# Patient Record
Sex: Male | Born: 1945 | Race: White | Hispanic: No | Marital: Married | Smoking: Former smoker
Health system: Southern US, Community
[De-identification: ages and names within clinical notes are randomized; demographics above are authoritative.]

## PROBLEM LIST (undated history)

## (undated) DIAGNOSIS — I10 Essential (primary) hypertension: Secondary | ICD-10-CM

## (undated) DIAGNOSIS — R011 Cardiac murmur, unspecified: Secondary | ICD-10-CM

## (undated) DIAGNOSIS — K219 Gastro-esophageal reflux disease without esophagitis: Secondary | ICD-10-CM

## (undated) DIAGNOSIS — E7800 Pure hypercholesterolemia, unspecified: Secondary | ICD-10-CM

## (undated) DIAGNOSIS — E119 Type 2 diabetes mellitus without complications: Secondary | ICD-10-CM

## (undated) HISTORY — PX: KNEE ARTHROSCOPY: SHX127

## (undated) HISTORY — PX: TONSILLECTOMY AND ADENOIDECTOMY: SUR1326

---

## 2006-03-12 ENCOUNTER — Encounter: Admission: RE | Admit: 2006-03-12 | Discharge: 2006-03-12 | Payer: Self-pay | Admitting: Nephrology

## 2013-09-27 ENCOUNTER — Other Ambulatory Visit: Payer: Self-pay | Admitting: Family Medicine

## 2013-09-27 DIAGNOSIS — R4701 Aphasia: Secondary | ICD-10-CM

## 2013-09-27 DIAGNOSIS — Z136 Encounter for screening for cardiovascular disorders: Secondary | ICD-10-CM

## 2013-10-09 ENCOUNTER — Ambulatory Visit
Admission: RE | Admit: 2013-10-09 | Discharge: 2013-10-09 | Disposition: A | Discharge status: Home / Self Care | Payer: Medicare Other | Source: Ambulatory Visit | Attending: Family Medicine | Admitting: Family Medicine

## 2013-10-09 DIAGNOSIS — R4701 Aphasia: Secondary | ICD-10-CM

## 2013-10-09 DIAGNOSIS — Z136 Encounter for screening for cardiovascular disorders: Secondary | ICD-10-CM

## 2014-02-13 HISTORY — PX: LAPAROSCOPIC ABDOMINAL EXPLORATION: SHX6249

## 2014-05-15 HISTORY — PX: LAPAROSCOPIC CHOLECYSTECTOMY: SUR755

## 2014-12-04 ENCOUNTER — Ambulatory Visit
Admission: RE | Admit: 2014-12-04 | Discharge: 2014-12-04 | Disposition: A | Discharge status: Home / Self Care | Payer: Medicare Other | Source: Ambulatory Visit | Attending: Family Medicine | Admitting: Family Medicine

## 2014-12-04 ENCOUNTER — Other Ambulatory Visit: Payer: Self-pay | Admitting: Family Medicine

## 2014-12-04 DIAGNOSIS — R945 Abnormal results of liver function studies: Secondary | ICD-10-CM

## 2014-12-04 DIAGNOSIS — R7989 Other specified abnormal findings of blood chemistry: Secondary | ICD-10-CM

## 2014-12-04 DIAGNOSIS — R1084 Generalized abdominal pain: Secondary | ICD-10-CM

## 2014-12-18 ENCOUNTER — Observation Stay (HOSPITAL_COMMUNITY)
Admission: EM | Admit: 2014-12-18 | Discharge: 2014-12-20 | Disposition: A | Discharge status: Home / Self Care | Payer: Medicare Other | Attending: Internal Medicine | Admitting: Internal Medicine

## 2014-12-18 ENCOUNTER — Encounter (HOSPITAL_COMMUNITY): Payer: Self-pay | Admitting: Emergency Medicine

## 2014-12-18 DIAGNOSIS — Z6830 Body mass index (BMI) 30.0-30.9, adult: Secondary | ICD-10-CM | POA: Insufficient documentation

## 2014-12-18 DIAGNOSIS — R1011 Right upper quadrant pain: Secondary | ICD-10-CM | POA: Diagnosis present

## 2014-12-18 DIAGNOSIS — E785 Hyperlipidemia, unspecified: Secondary | ICD-10-CM | POA: Insufficient documentation

## 2014-12-18 DIAGNOSIS — I10 Essential (primary) hypertension: Secondary | ICD-10-CM | POA: Diagnosis not present

## 2014-12-18 DIAGNOSIS — Z7982 Long term (current) use of aspirin: Secondary | ICD-10-CM | POA: Diagnosis not present

## 2014-12-18 DIAGNOSIS — E669 Obesity, unspecified: Secondary | ICD-10-CM | POA: Insufficient documentation

## 2014-12-18 DIAGNOSIS — K8050 Calculus of bile duct without cholangitis or cholecystitis without obstruction: Secondary | ICD-10-CM

## 2014-12-18 DIAGNOSIS — L299 Pruritus, unspecified: Secondary | ICD-10-CM | POA: Diagnosis not present

## 2014-12-18 DIAGNOSIS — E119 Type 2 diabetes mellitus without complications: Secondary | ICD-10-CM

## 2014-12-18 DIAGNOSIS — K8051 Calculus of bile duct without cholangitis or cholecystitis with obstruction: Principal | ICD-10-CM | POA: Insufficient documentation

## 2014-12-18 DIAGNOSIS — K831 Obstruction of bile duct: Secondary | ICD-10-CM | POA: Insufficient documentation

## 2014-12-18 DIAGNOSIS — Z87891 Personal history of nicotine dependence: Secondary | ICD-10-CM | POA: Insufficient documentation

## 2014-12-18 DIAGNOSIS — E780 Pure hypercholesterolemia: Secondary | ICD-10-CM | POA: Insufficient documentation

## 2014-12-18 DIAGNOSIS — E869 Volume depletion, unspecified: Secondary | ICD-10-CM | POA: Diagnosis not present

## 2014-12-18 DIAGNOSIS — Z79899 Other long term (current) drug therapy: Secondary | ICD-10-CM | POA: Insufficient documentation

## 2014-12-18 HISTORY — DX: Essential (primary) hypertension: I10

## 2014-12-18 HISTORY — DX: Pure hypercholesterolemia, unspecified: E78.00

## 2014-12-18 HISTORY — DX: Gastro-esophageal reflux disease without esophagitis: K21.9

## 2014-12-18 HISTORY — DX: Type 2 diabetes mellitus without complications: E11.9

## 2014-12-18 HISTORY — DX: Cardiac murmur, unspecified: R01.1

## 2014-12-18 LAB — CBC
HCT: 36.2 % — ABNORMAL LOW (ref 39.0–52.0)
Hemoglobin: 12.3 g/dL — ABNORMAL LOW (ref 13.0–17.0)
MCH: 31.5 pg (ref 26.0–34.0)
MCHC: 34 g/dL (ref 30.0–36.0)
MCV: 92.8 fL (ref 78.0–100.0)
Platelets: 245 10*3/uL (ref 150–400)
RBC: 3.9 MIL/uL — ABNORMAL LOW (ref 4.22–5.81)
RDW: 15.1 % (ref 11.5–15.5)
WBC: 5.2 10*3/uL (ref 4.0–10.5)

## 2014-12-18 LAB — COMPREHENSIVE METABOLIC PANEL
ALT: 833 U/L — ABNORMAL HIGH (ref 17–63)
AST: 319 U/L — ABNORMAL HIGH (ref 15–41)
Albumin: 3.4 g/dL — ABNORMAL LOW (ref 3.5–5.0)
Alkaline Phosphatase: 436 U/L — ABNORMAL HIGH (ref 38–126)
Anion gap: 9 (ref 5–15)
BUN: 19 mg/dL (ref 6–20)
CO2: 26 mmol/L (ref 22–32)
Calcium: 9.4 mg/dL (ref 8.9–10.3)
Chloride: 103 mmol/L (ref 101–111)
Creatinine, Ser: 1.31 mg/dL — ABNORMAL HIGH (ref 0.61–1.24)
GFR calc Af Amer: >60 mL/min (ref >60)
GFR calc non Af Amer: 55 mL/min — ABNORMAL LOW (ref >60)
Glucose, Bld: 167 mg/dL — ABNORMAL HIGH (ref 65–99)
Potassium: 4.4 mmol/L (ref 3.5–5.1)
Sodium: 138 mmol/L (ref 135–145)
Total Bilirubin: 5.2 mg/dL — ABNORMAL HIGH (ref 0.3–1.2)
Total Protein: 6.5 g/dL (ref 6.5–8.1)

## 2014-12-18 LAB — LIPASE, BLOOD: Lipase: 119 U/L — ABNORMAL HIGH (ref 22–51)

## 2014-12-18 MED ORDER — AMLODIPINE BESYLATE 10 MG PO TABS
10.0000 mg | ORAL_TABLET | Freq: Every evening | ORAL | Status: DC
  Administered 2014-12-18 – 2014-12-19 (×2): 10 mg via ORAL
  Filled 2014-12-18 (×2): qty 1

## 2014-12-18 MED ORDER — ENOXAPARIN SODIUM 40 MG/0.4ML ~~LOC~~ SOLN
40.0000 mg | SUBCUTANEOUS | Status: DC
  Administered 2014-12-18 – 2014-12-19 (×2): 40 mg via SUBCUTANEOUS
  Filled 2014-12-18 (×2): qty 0.4

## 2014-12-18 MED ORDER — POTASSIUM CHLORIDE CRYS ER 20 MEQ PO TBCR
20.0000 meq | EXTENDED_RELEASE_TABLET | Freq: Every day | ORAL | Status: DC
  Filled 2014-12-18: qty 1

## 2014-12-18 MED ORDER — ADULT MULTIVITAMIN W/MINERALS CH
1.0000 | ORAL_TABLET | Freq: Every day | ORAL | Status: DC
  Administered 2014-12-18 – 2014-12-19 (×2): 1 via ORAL
  Filled 2014-12-18 (×3): qty 1

## 2014-12-18 MED ORDER — LISINOPRIL 20 MG PO TABS
20.0000 mg | ORAL_TABLET | Freq: Two times a day (BID) | ORAL | Status: DC
  Administered 2014-12-18 – 2014-12-19 (×3): 20 mg via ORAL
  Filled 2014-12-18 (×4): qty 1

## 2014-12-18 MED ORDER — HYDROXYZINE HCL 25 MG PO TABS
50.0000 mg | ORAL_TABLET | Freq: Once | ORAL | Status: AC
  Administered 2014-12-18: 50 mg via ORAL
  Filled 2014-12-18: qty 2

## 2014-12-18 MED ORDER — KCL IN DEXTROSE-NACL 20-5-0.2 MEQ/L-%-% IV SOLN
INTRAVENOUS | Status: DC
  Administered 2014-12-18: 21:00:00 via INTRAVENOUS
  Filled 2014-12-18 (×4): qty 1000

## 2014-12-18 MED ORDER — HYDROCODONE-ACETAMINOPHEN 5-325 MG PO TABS
1.0000 | ORAL_TABLET | ORAL | Status: DC | PRN
  Administered 2014-12-19: 2 via ORAL
  Filled 2014-12-18: qty 2

## 2014-12-18 MED ORDER — ACETAMINOPHEN 325 MG PO TABS: 650.0000 mg | ORAL_TABLET | Freq: Four times a day (QID) | ORAL | Status: DC | PRN

## 2014-12-18 MED ORDER — BISACODYL 5 MG PO TBEC: 5.0000 mg | DELAYED_RELEASE_TABLET | Freq: Every day | ORAL | Status: DC | PRN

## 2014-12-18 MED ORDER — BOOST / RESOURCE BREEZE PO LIQD
1.0000 | Freq: Three times a day (TID) | ORAL | Status: DC
  Administered 2014-12-18 – 2014-12-19 (×2): 1 via ORAL

## 2014-12-18 MED ORDER — ONDANSETRON HCL 4 MG/2ML IJ SOLN
4.0000 mg | Freq: Four times a day (QID) | INTRAMUSCULAR | Status: DC | PRN
Start: 2014-12-18 — End: 2014-12-20

## 2014-12-18 MED ORDER — METFORMIN HCL 500 MG PO TABS
1000.0000 mg | ORAL_TABLET | Freq: Two times a day (BID) | ORAL | Status: DC
  Filled 2014-12-18: qty 2

## 2014-12-18 MED ORDER — ACETAMINOPHEN 650 MG RE SUPP: 650.0000 mg | Freq: Four times a day (QID) | RECTAL | Status: DC | PRN

## 2014-12-18 MED ORDER — INSULIN ASPART 100 UNIT/ML ~~LOC~~ SOLN
0.0000 [IU] | Freq: Three times a day (TID) | SUBCUTANEOUS | Status: DC
  Administered 2014-12-19 – 2014-12-20 (×4): 2 [IU] via SUBCUTANEOUS

## 2014-12-18 MED ORDER — ONDANSETRON HCL 4 MG PO TABS: 4.0000 mg | ORAL_TABLET | Freq: Four times a day (QID) | ORAL | Status: DC | PRN

## 2014-12-18 MED ORDER — PANTOPRAZOLE SODIUM 40 MG PO TBEC
40.0000 mg | DELAYED_RELEASE_TABLET | Freq: Every day | ORAL | Status: DC
  Administered 2014-12-18 – 2014-12-19 (×2): 40 mg via ORAL
  Filled 2014-12-18 (×3): qty 1

## 2014-12-18 NOTE — ED Notes (Signed)
Pt to be NPO at midnight.

## 2014-12-18 NOTE — ED Provider Notes (Signed)
CSN: [id number]     Arrival date & time 12/18/14  1411 History   First MD Initiated Contact with Patient 12/18/14 [phone]     Chief Complaint  Patient presents with  . Pruritis  . Abdominal Pain     (Consider location/radiation/quality/duration/timing/severity/associated sxs/prior Treatment) HPI Comments: Had recent US done by [facility] practice due to intermittent RUQ pain. Showed 3 mm stone in his CBD with mild biliary dilatation. [facility] instructed him to come to the ED for ERCP tomorrow. No pain at this time.  Patient is a 69 y.o. male presenting with abdominal pain. The history is provided by the patient.  Abdominal Pain Pain location:  RUQ Pain quality: sharp   Pain radiates to:  Does not radiate Pain severity:  Moderate Onset quality:  Gradual Timing:  Sporadic Progression:  Unchanged Chronicity:  New Context: not medication withdrawal and not previous surgeries   Relieved by:  Nothing Worsened by:  Nothing tried Associated symptoms: no chest pain, no chills, no cough, no fever, no shortness of breath and no vomiting     Past Medical History  Diagnosis Date  . Diabetes mellitus without complication   . Hypertension   . Hypercholesterolemia    History reviewed. No pertinent past surgical history. No family history on file. History  Substance Use Topics  . Smoking status: Former Games developer  . Smokeless tobacco: Not on file  . Alcohol Use: Yes    Review of Systems  Constitutional: Negative for fever and chills.  Respiratory: Negative for cough and shortness of breath.   Cardiovascular: Negative for chest pain and leg swelling.  Gastrointestinal: Negative for vomiting and abdominal pain.  Genitourinary: Negative for difficulty urinating.  Skin:       Pruritis - diffuse  All other systems reviewed and are negative.     Allergies  Review of patient's allergies indicates not on file.  Home Medications   Prior to Admission medications   Not on File   BP  138/69 mmHg  Pulse 81  Temp(Src) 98.1 F (36.7 C) (Oral)  Resp 18  Ht  (1.803 m)  Wt 217 lb (98.431 kg)  BMI 30.28 kg/m2  SpO2 99% Physical Exam  Constitutional: He is oriented to person, place, and time. He appears well-developed and well-nourished. No distress.  HENT:  Head: Normocephalic and atraumatic.  Mouth/Throat: No oropharyngeal exudate.  Eyes: EOM are normal. Pupils are equal, round, and reactive to light.  Neck: Normal range of motion. Neck supple.  Cardiovascular: Normal rate and regular rhythm.  Exam reveals no friction rub.   No murmur heard. Pulmonary/Chest: Effort normal and breath sounds normal. No respiratory distress. He has no wheezes. He has no rales.  Abdominal: Soft. He exhibits no distension. There is no tenderness. There is no rebound.  Musculoskeletal: Normal range of motion. He exhibits no edema.  Neurological: He is alert and oriented to person, place, and time. No cranial nerve deficit. He exhibits normal muscle tone. Coordination normal.  Skin: No rash noted. He is not diaphoretic.  Nursing note and vitals reviewed.   ED Course  Procedures (including critical care time) Labs Review Labs Reviewed  CBC - Abnormal; Notable for the following:    RBC 3.90 (*)    Hemoglobin 12.3 (*)    HCT 36.2 (*)    All other components within normal limits  COMPREHENSIVE METABOLIC PANEL - Abnormal; Notable for the following:    Glucose, Bld 167 (*)    Creatinine, Ser 1.31 (*)  Albumin 3.4 (*)    AST 319 (*)    ALT 833 (*)    Alkaline Phosphatase 436 (*)    Total Bilirubin 5.2 (*)    GFR calc non Af Amer 55 (*)    All other components within normal limits  LIPASE, BLOOD - Abnormal; Notable for the following:    Lipase 119 (*)    All other components within normal limits    Imaging Review No results found.   EKG Interpretation None      MDM   Final diagnoses:  Common bile duct stone  Obstructive jaundice    32M here with RUQ pain, sent  by [facility] for admission for ERCP. Has been having worsening pruritis and sporadic RUQ pain attacks. No fevers or vomiting. No pain at this time.  Labs show bilirubin of 5.2, elevated LFTs. Dr. Madilyn FiremanHayes aware, will see tomorrow. Dr. Arlean HoppingSchertz admitting.  Elwin MochaBlair Evanee Lubrano, MD 12/18/14 [phone]

## 2014-12-18 NOTE — ED Notes (Signed)
MD at bedside. 

## 2014-12-18 NOTE — H&P (Signed)
[facility] History and Physical  Mitchell Fortesed R Geibel XBM:[id number] DOB: 1945/11/20 DOA: 12/18/2014  Referring physician: Dr Gwendolyn GrantWalden, ED PCP: Dr Sigmund HazelLisa Miller, [facility]: Dr Barrett ShellHayes, [facility]  Chief Complaint: RUQ pain and pruritis  HPI: Mitchell Pham is a 69 y.o. male with hx of HTN and mild DM type 2. He had cholecystectomy in 12/ 15 uncomplicated. Recently was found to have jaundice in conjunction with intermittent bouts of RUQ pain.  Workup last week with US at outside facility showed common bile duct obstruction and he is felt to have a retained stone and in need of ERCP. GI has requested admission for this reason.    Patient describes intermittent RUQ pain episodes lasting for hours.  Also some indigestion and nausea, no vomiting or diarrhea.  No fevers or chills.  No CP, SOB or cough.  ROS   no joint pain  no skin rash  +pruritis last night  + yellow tint to his eyes  orange urine, slow stream recently  no confusion, HA's    Review of Systems: As presented in the history of presenting illness, rest negative.  Past Medical History  Diagnosis Date  . Diabetes mellitus without complication   . Hypertension   . Hypercholesterolemia    History reviewed. No pertinent past surgical history. Social History:  reports that he has quit smoking. He does not have any smokeless tobacco history on file. He reports that he drinks alcohol. He reports that he does not use illicit drugs. Where does patient live home , retired Can patient participate in ADLs? yes  No Known Allergies  Family History: No family history on file.   Prior to Admission medications   Medication Sig Start Date End Date Taking? Authorizing Provider  allopurinol (ZYLOPRIM) 300 MG tablet Take 300 mg by mouth 2 (two) times daily.   Yes Historical Provider, MD  amLODipine (NORVASC) 10 MG tablet Take 10 mg by mouth every evening.   Yes Historical Provider, MD  aspirin 81 MG tablet Take 81 mg by mouth daily.    Yes Historical Provider, MD  atorvastatin (LIPITOR) 10 MG tablet Take 10 mg by mouth daily.   Yes Historical Provider, MD  lisinopril (PRINIVIL,ZESTRIL) 20 MG tablet Take 20 mg by mouth 2 (two) times daily.   Yes Historical Provider, MD  metFORMIN (GLUCOPHAGE) 500 MG tablet Take 500 mg by mouth 4 (four) times daily.   Yes Historical Provider, MD  Misc Natural Products (OSTEO BI-FLEX JOINT SHIELD PO) Take 1 tablet by mouth 2 (two) times daily.   Yes Historical Provider, MD  Multiple Vitamin (MULTIVITAMIN WITH MINERALS) TABS tablet Take 1 tablet by mouth daily.   Yes Historical Provider, MD  omeprazole (PRILOSEC) 20 MG capsule Take 20 mg by mouth See admin instructions. Take 1 tablet by mouth only on Tuesday, Thursdays and saturdays   Yes Historical Provider, MD  potassium chloride SA (K-DUR,KLOR-CON) 20 MEQ tablet Take 20 mEq by mouth daily.   Yes Historical Provider, MD   Labs on Admission:  Basic Metabolic Panel:  Recent Labs Lab 12/18/14 1656  NA 138  K 4.4  CL 103  CO2 26  GLUCOSE 167*  BUN 19  CREATININE 1.31*  CALCIUM 9.4   Liver Function Tests:  Recent Labs Lab 12/18/14 1656  AST 319*  ALT 833*  ALKPHOS 436*  BILITOT 5.2*  PROT 6.5  ALBUMIN 3.4*    Recent Labs Lab 12/18/14 1656  LIPASE 119*   No results for input(s): AMMONIA in  the last 168 hours. CBC:  Recent Labs Lab 12/18/14 1656  WBC 5.2  HGB 12.3*  HCT 36.2*  MCV 92.8  PLT 245   Cardiac Enzymes: No results for input(s): CKTOTAL, CKMB, CKMBINDEX, TROPONINI in the last 168 hours.  BNP (last 3 results) No results for input(s): BNP in the last 8760 hours.  ProBNP (last 3 results) No results for input(s): PROBNP in the last 8760 hours.  CBG: No results for input(s): GLUCAP in the last 168 hours.  Radiological Exams on Admission: No results found.  EKG: pending  Physical Exam: Filed Vitals:   12/18/14 1730 12/18/14 1800 12/18/14 1830 12/18/14 1845  BP: 143/72 128/68 130/71 140/77  Pulse:  81 82 77 81  Temp:      TempSrc:      Resp:      Height:      Weight:      SpO2: 100% 97% 99% 99%   Exam  General:alert, icteric, no distress, calm  Eyes: PERRL, EOMI  ENT: no deformity  Neck: no jvd, no bruits or nodes  Cardiovascular: RRR no MRG  Respiratory: clear bilat , good BS, no rales  Abdomen: soft ntnd no mass or ascites, no hsm  Skin: no ulcers, wounds or gangrene. No LE or UE edema  Musculoskeletal: no joint effusion or deformity  Psychiatric: good judgment and good insight  Neurologic: nonfocal, Ox 3  Assessment/Plan Principal Problem:   Common bile duct (CBD) obstruction Active Problems:   RUQ pain   Pruritus   Hypertension   DM type 2 (diabetes mellitus, type 2)  Assessment: 1. Jaundice/ RUQ pain/ obstructed CBD/ sp chole Dec '15- for admission and work-up per GI, possible ERCP tomorrow. Will keep NPO after MN. Give IVFs' 2. Vol depletion, mild 3. HTN on 2 BP medications 4. DM 2 on metformin 5. HL on statin  Plan - as above   DVT Prophylaxis - LMW heparin  Code Status: full  Family Communication: none here  Disposition Plan: home when stable    Elizzie Westergard D [facility] Pager [phone]  If 7PM-7AM, please contact night-coverage [email or web address] Password [email or web address] 12/18/2014, 7:20 PM

## 2014-12-18 NOTE — ED Notes (Signed)
Pt. Stated, Im here because Im suppose to have a procedure to remove a stone and also I started itching all over . Dr. Dulce Sellarutlaw told me to come over here.

## 2014-12-18 NOTE — ED Notes (Signed)
Turkey sandwich and water provided.

## 2014-12-19 ENCOUNTER — Encounter (HOSPITAL_COMMUNITY): Payer: Self-pay

## 2014-12-19 ENCOUNTER — Encounter (HOSPITAL_COMMUNITY)
Admission: EM | Disposition: A | Discharge status: Home / Self Care | Payer: Self-pay | Source: Home / Self Care | Attending: Emergency Medicine

## 2014-12-19 ENCOUNTER — Observation Stay (HOSPITAL_COMMUNITY): Payer: Medicare Other | Admitting: Anesthesiology

## 2014-12-19 ENCOUNTER — Observation Stay (HOSPITAL_COMMUNITY): Payer: Medicare Other

## 2014-12-19 DIAGNOSIS — Z9889 Other specified postprocedural states: Secondary | ICD-10-CM | POA: Diagnosis not present

## 2014-12-19 DIAGNOSIS — E119 Type 2 diabetes mellitus without complications: Secondary | ICD-10-CM

## 2014-12-19 DIAGNOSIS — K8051 Calculus of bile duct without cholangitis or cholecystitis with obstruction: Secondary | ICD-10-CM | POA: Diagnosis not present

## 2014-12-19 DIAGNOSIS — K831 Obstruction of bile duct: Secondary | ICD-10-CM

## 2014-12-19 DIAGNOSIS — I10 Essential (primary) hypertension: Secondary | ICD-10-CM

## 2014-12-19 DIAGNOSIS — E780 Pure hypercholesterolemia: Secondary | ICD-10-CM | POA: Diagnosis not present

## 2014-12-19 DIAGNOSIS — R748 Abnormal levels of other serum enzymes: Secondary | ICD-10-CM | POA: Diagnosis not present

## 2014-12-19 HISTORY — PX: ERCP: SHX5425

## 2014-12-19 LAB — COMPREHENSIVE METABOLIC PANEL
ALT: 663 U/L — ABNORMAL HIGH (ref 17–63)
AST: 203 U/L — ABNORMAL HIGH (ref 15–41)
Albumin: 3.2 g/dL — ABNORMAL LOW (ref 3.5–5.0)
Alkaline Phosphatase: 388 U/L — ABNORMAL HIGH (ref 38–126)
Anion gap: 8 (ref 5–15)
BUN: 14 mg/dL (ref 6–20)
CO2: 23 mmol/L (ref 22–32)
Calcium: 9.3 mg/dL (ref 8.9–10.3)
Chloride: 106 mmol/L (ref 101–111)
Creatinine, Ser: 1.23 mg/dL (ref 0.61–1.24)
GFR calc Af Amer: >60 mL/min (ref >60)
GFR calc non Af Amer: 59 mL/min — ABNORMAL LOW (ref >60)
Glucose, Bld: 240 mg/dL — ABNORMAL HIGH (ref 65–99)
Potassium: 4.4 mmol/L (ref 3.5–5.1)
Sodium: 137 mmol/L (ref 135–145)
Total Bilirubin: 5.2 mg/dL — ABNORMAL HIGH (ref 0.3–1.2)
Total Protein: 6.4 g/dL — ABNORMAL LOW (ref 6.5–8.1)

## 2014-12-19 LAB — CBC
HCT: 34.8 % — ABNORMAL LOW (ref 39.0–52.0)
Hemoglobin: 11.6 g/dL — ABNORMAL LOW (ref 13.0–17.0)
MCH: 30.9 pg (ref 26.0–34.0)
MCHC: 33.3 g/dL (ref 30.0–36.0)
MCV: 92.6 fL (ref 78.0–100.0)
Platelets: 238 10*3/uL (ref 150–400)
RBC: 3.76 MIL/uL — ABNORMAL LOW (ref 4.22–5.81)
RDW: 15.1 % (ref 11.5–15.5)
WBC: 5.7 10*3/uL (ref 4.0–10.5)

## 2014-12-19 LAB — GLUCOSE, CAPILLARY
Glucose-Capillary: 182 mg/dL — ABNORMAL HIGH (ref 65–99)
Glucose-Capillary: 167 mg/dL — ABNORMAL HIGH (ref 65–99)
Glucose-Capillary: 160 mg/dL — ABNORMAL HIGH (ref 65–99)
Glucose-Capillary: 262 mg/dL — ABNORMAL HIGH (ref 65–99)

## 2014-12-19 SURGERY — ERCP, WITH INTERVENTION IF INDICATED
Anesthesia: General

## 2014-12-19 MED ORDER — LIDOCAINE HCL (CARDIAC) 20 MG/ML IV SOLN
INTRAVENOUS | Status: DC | PRN
  Administered 2014-12-19: 100 mg via INTRAVENOUS

## 2014-12-19 MED ORDER — LACTATED RINGERS IV SOLN
INTRAVENOUS | Status: DC | PRN
  Administered 2014-12-19: 14:00:00 via INTRAVENOUS

## 2014-12-19 MED ORDER — PROPOFOL 10 MG/ML IV BOLUS
INTRAVENOUS | Status: DC | PRN
  Administered 2014-12-19: 150 mg via INTRAVENOUS

## 2014-12-19 MED ORDER — PHENYLEPHRINE HCL 10 MG/ML IJ SOLN
INTRAMUSCULAR | Status: DC | PRN
  Administered 2014-12-19 (×2): 120 ug via INTRAVENOUS
  Administered 2014-12-19: 80 ug via INTRAVENOUS
  Administered 2014-12-19: 120 ug via INTRAVENOUS
  Administered 2014-12-19 (×2): 80 ug via INTRAVENOUS
  Administered 2014-12-19: 120 ug via INTRAVENOUS

## 2014-12-19 MED ORDER — MIDAZOLAM HCL 5 MG/5ML IJ SOLN
INTRAMUSCULAR | Status: DC | PRN
  Administered 2014-12-19: 2 mg via INTRAVENOUS

## 2014-12-19 MED ORDER — PHENYLEPHRINE HCL 10 MG/ML IJ SOLN
10.0000 mg | INTRAVENOUS | Status: DC | PRN
  Administered 2014-12-19: 100 ug/min via INTRAVENOUS

## 2014-12-19 MED ORDER — IOHEXOL 350 MG/ML SOLN
INTRAVENOUS | Status: DC | PRN
  Administered 2014-12-19: 40 mL

## 2014-12-19 MED ORDER — SODIUM CHLORIDE 0.9 % IV SOLN: INTRAVENOUS | Status: DC

## 2014-12-19 MED ORDER — SUCCINYLCHOLINE CHLORIDE 20 MG/ML IJ SOLN
INTRAMUSCULAR | Status: DC | PRN
  Administered 2014-12-19: 100 mg via INTRAVENOUS

## 2014-12-19 MED ORDER — FENTANYL CITRATE (PF) 100 MCG/2ML IJ SOLN
INTRAMUSCULAR | Status: DC | PRN
  Administered 2014-12-19: 100 ug via INTRAVENOUS

## 2014-12-19 MED ORDER — FENTANYL CITRATE (PF) 100 MCG/2ML IJ SOLN: 25.0000 ug | INTRAMUSCULAR | Status: DC | PRN

## 2014-12-19 MED ORDER — PROMETHAZINE HCL 25 MG/ML IJ SOLN: 6.2500 mg | INTRAMUSCULAR | Status: DC | PRN

## 2014-12-19 NOTE — Consult Note (Signed)
[facility] Consult Note  Referring Provider: No ref. provider found Primary Care Physician:  Tawanna Solo, MD Primary Gastroenterologist:  Dr.  Laurel Dimmer Complaint: Abdominal pain and pruritus HPI: Mitchell Pham is an 69 y.o. white male  with a history of cholecystectomy in December 2015 presents with intermittent right upper quadrant colicky abdominal pain with elevated LFTs and pruritus. He had an MRCP recently which showed a 3 mm common bile duct stone and presents for ERCP. He has no other complaints.  Past Medical History  Diagnosis Date  . Hypertension   . Hypercholesterolemia   . Heart murmur     "told I have one once"  . Type II diabetes mellitus   . GERD (gastroesophageal reflux disease)     Past Surgical History  Procedure Laterality Date  . Tonsillectomy and adenoidectomy  ~ 1955  . Knee arthroscopy Left ~ 1988  . Laparoscopic cholecystectomy  05/2014  . Laparoscopic abdominal exploration  02/2014    "tried to take my gallbladder out; it was too inflamed"    Medications Prior to Admission  Medication Sig Dispense Refill  . allopurinol (ZYLOPRIM) 300 MG tablet Take 300 mg by mouth 2 (two) times daily.    [name] Kitchen amLODipine (NORVASC) 10 MG tablet Take 10 mg by mouth every evening.    [name] Kitchen aspirin 81 MG tablet Take 81 mg by mouth daily.    [name] Kitchen atorvastatin (LIPITOR) 10 MG tablet Take 10 mg by mouth daily.    [name] Kitchen lisinopril (PRINIVIL,ZESTRIL) 20 MG tablet Take 20 mg by mouth 2 (two) times daily.    . metFORMIN (GLUCOPHAGE) 500 MG tablet Take 1,000 mg by mouth 2 (two) times daily with a meal.     . Misc Natural Products (OSTEO BI-FLEX JOINT SHIELD PO) Take 1 tablet by mouth 2 (two) times daily.    . Multiple Vitamin (MULTIVITAMIN WITH MINERALS) TABS tablet Take 1 tablet by mouth daily.    [name] Kitchen omeprazole (PRILOSEC) 20 MG capsule Take 20 mg by mouth See admin instructions. Take 1 tablet by mouth only on Tuesday, Thursdays and saturdays    . potassium chloride SA  (K-DUR,KLOR-CON) 20 MEQ tablet Take 20 mEq by mouth daily.      Allergies: No Known Allergies  History reviewed. No pertinent family history.  Social History:  reports that he quit smoking about 28 years ago. His smoking use included Cigarettes. He has a 40 pack-year smoking history. He has never used smokeless tobacco. He reports that he drinks about 3.6 oz of alcohol per week. He reports that he does not use illicit drugs.  Review of Systems: negative except as above   Blood pressure 110/48, pulse 73, temperature 99.1 F (37.3 C), temperature source Oral, resp. rate 16, height $RemoveBe'5\' 11"'jRjrdMylm$  (1.803 m), weight 98.431 kg (217 lb), SpO2 99 %. Head: Normocephalic, without obvious abnormality, atraumatic Neck: no adenopathy, no carotid bruit, no JVD, supple, symmetrical, trachea midline and thyroid not enlarged, symmetric, no tenderness/mass/nodules Resp: clear to auscultation bilaterally Cardio: regular rate and rhythm, S1, S2 normal, no murmur, click, rub or gallop GI: Abdomen soft nondistended with normoactive bowel sounds. No hepatomegaly masses or guarding Extremities: extremities normal, atraumatic, no cyanosis or edema  Results for orders placed or performed during the hospital encounter of 12/18/14 (from the past 48 hour(s))  CBC     Status: Abnormal   Collection Time: 12/18/14  4:56 PM  Result Value Ref Range   WBC 5.2 4.0 - 10.5 K/uL   RBC 3.90 (L) 4.22 -  5.81 MIL/uL   Hemoglobin 12.3 (L) 13.0 - 17.0 g/dL   HCT 36.2 (L) 39.0 - 52.0 %   MCV 92.8 78.0 - 100.0 fL   MCH 31.5 26.0 - 34.0 pg   MCHC 34.0 30.0 - 36.0 g/dL   RDW 15.1 11.5 - 15.5 %   Platelets 245 150 - 400 K/uL  Comprehensive metabolic panel     Status: Abnormal   Collection Time: 12/18/14  4:56 PM  Result Value Ref Range   Sodium 138 135 - 145 mmol/L   Potassium 4.4 3.5 - 5.1 mmol/L   Chloride 103 101 - 111 mmol/L   CO2 26 22 - 32 mmol/L   Glucose, Bld 167 (H) 65 - 99 mg/dL   BUN 19 6 - 20 mg/dL   Creatinine, Ser  1.31 (H) 0.61 - 1.24 mg/dL   Calcium 9.4 8.9 - 10.3 mg/dL   Total Protein 6.5 6.5 - 8.1 g/dL   Albumin 3.4 (L) 3.5 - 5.0 g/dL   AST 319 (H) 15 - 41 U/L   ALT 833 (H) 17 - 63 U/L   Alkaline Phosphatase 436 (H) 38 - 126 U/L   Total Bilirubin 5.2 (H) 0.3 - 1.2 mg/dL   GFR calc non Af Amer 55 (L) >60 mL/min   GFR calc Af Amer >60 >60 mL/min    Comment: (NOTE) The eGFR has been calculated using the CKD EPI equation. This calculation has not been validated in all clinical situations. eGFR's persistently <60 mL/min signify possible Chronic Kidney Disease.    Anion gap 9 5 - 15  Lipase, blood     Status: Abnormal   Collection Time: 12/18/14  4:56 PM  Result Value Ref Range   Lipase 119 (H) 22 - 51 U/L  Comprehensive metabolic panel     Status: Abnormal   Collection Time: 12/19/14  4:26 AM  Result Value Ref Range   Sodium 137 135 - 145 mmol/L   Potassium 4.4 3.5 - 5.1 mmol/L   Chloride 106 101 - 111 mmol/L   CO2 23 22 - 32 mmol/L   Glucose, Bld 240 (H) 65 - 99 mg/dL   BUN 14 6 - 20 mg/dL   Creatinine, Ser 1.23 0.61 - 1.24 mg/dL   Calcium 9.3 8.9 - 10.3 mg/dL   Total Protein 6.4 (L) 6.5 - 8.1 g/dL   Albumin 3.2 (L) 3.5 - 5.0 g/dL   AST 203 (H) 15 - 41 U/L   ALT 663 (H) 17 - 63 U/L   Alkaline Phosphatase 388 (H) 38 - 126 U/L   Total Bilirubin 5.2 (H) 0.3 - 1.2 mg/dL   GFR calc non Af Amer 59 (L) >60 mL/min   GFR calc Af Amer >60 >60 mL/min    Comment: (NOTE) The eGFR has been calculated using the CKD EPI equation. This calculation has not been validated in all clinical situations. eGFR's persistently <60 mL/min signify possible Chronic Kidney Disease.    Anion gap 8 5 - 15  CBC     Status: Abnormal   Collection Time: 12/19/14  4:26 AM  Result Value Ref Range   WBC 5.7 4.0 - 10.5 K/uL   RBC 3.76 (L) 4.22 - 5.81 MIL/uL   Hemoglobin 11.6 (L) 13.0 - 17.0 g/dL   HCT 34.8 (L) 39.0 - 52.0 %   MCV 92.6 78.0 - 100.0 fL   MCH 30.9 26.0 - 34.0 pg   MCHC 33.3 30.0 - 36.0 g/dL   RDW  15.1 11.5 - 15.5 %  Platelets 238 150 - 400 K/uL  Glucose, capillary     Status: Abnormal   Collection Time: 12/19/14  7:31 AM  Result Value Ref Range   Glucose-Capillary 182 (H) 65 - 99 mg/dL   No results found.  Assessment: Common bile duct stone Plan:  Will proceed with ERCP. Risks rationale and alternatives were explained to the patient and he wishes to proceed. Damika Harmon C 12/19/2014, 8:18 AM  Pager [phone] If no answer or after 5 PM call [phone]

## 2014-12-19 NOTE — Progress Notes (Signed)
Inpatient Diabetes Program Recommendations  AACE/ADA: New Consensus Statement on Inpatient Glycemic Control (2013)  Target Ranges:  Prepandial:   less than 140 mg/dL      Peak postprandial:   less than 180 mg/dL (1-2 hours)      Critically ill patients:  140 - 180 mg/dL    Inpatient Diabetes Program Recommendations Correction (SSI): Noted order for sensitive correction tidwc; however patient is NPO for ERCP this afternoon-unsure of po status folllowing procedure. May want to consider changing correction q 4 hrs during this time. Oral Agents: Metformin on hold while po intake is NPO or minimal  Thank you Lenor CoffinAnn Banesa Tristan, RN, MSN, CDE  Diabetes Inpatient Program Office: [phone] Pager: [phone] 8:00 am to 5:00 pm

## 2014-12-19 NOTE — Transfer of Care (Signed)
Immediate Anesthesia Transfer of Care Note  Patient: Mitchell Pham  Procedure(s) Performed: Procedure(s): ENDOSCOPIC RETROGRADE CHOLANGIOPANCREATOGRAPHY (ERCP) (N/A)  Patient Location: Endoscopy Unit  Anesthesia Type:General  Level of Consciousness: awake  Airway & Oxygen Therapy: Patient Spontanous Breathing and Patient connected to nasal cannula oxygen  Post-op Assessment: Report given to RN, Post -op Vital signs reviewed and stable and Patient moving all extremities  Post vital signs: Reviewed and stable  Last Vitals:  Filed Vitals:   12/19/14 1555  BP: 142/54  Pulse: 73  Temp: 36.8 C  Resp: 15    Complications: No apparent anesthesia complications

## 2014-12-19 NOTE — Progress Notes (Signed)
PROGRESS NOTE  Mitchell Pham UJW:[id number] DOB: [birth year] DOA: 12/18/2014 PCP: Neldon Labella, MD  HPI/Recap of past 24 hours:  S/p ERCP with stone extraction, feeling better, denies n/v, no pain, tolerating diet, ambulating.  Assessment/Plan: Principal Problem:   Common bile duct (CBD) obstruction Active Problems:   RUQ pain   Pruritus   Hypertension   DM type 2 (diabetes mellitus, type 2)  Obstructed CBD stone s/p ERCP with sphincterotomy and stone extraction, expecting lft/lipase trending down.   noninsulin dependent diabetes: reported last a1c was between 5-6, metformin held here, resume at discharge.  Htn: well controlled on norvasc, lisinopril. Resume asa in am.  Code Status: full  Family Communication: patient and wife in room  Disposition Plan: home tomorrow if medically stable   Consultants:  GI  Procedures: ERCP with sphincterotomy and stone extraction 7/6  Antibiotics:  none   Objective: BP 128/70 mmHg  Pulse 68  Temp(Src) 98.3 F (36.8 C) (Oral)  Resp 16  Ht  (1.803 m)  Wt 98.431 kg (217 lb)  BMI 30.28 kg/m2  SpO2 99%  Intake/Output Summary (Last 24 hours) at 12/19/14 1830 Last data filed at 12/19/14 1551  Gross per 24 hour  Intake   1500 ml  Output    350 ml  Net   1150 ml   Filed Weights   12/18/14 1444 12/18/14 2012  Weight: 98.431 kg (217 lb) 98.431 kg (217 lb)    Exam:   General:  NAD  Cardiovascular: RRR  Respiratory: CTABL  Abdomen: Soft/ND/NT, positive BS  Musculoskeletal: No Edema  Neuro: aaox3  Data Reviewed: Basic Metabolic Panel:  Recent Labs Lab 12/18/14 1656 12/19/14 0426  NA 138 137  K 4.4 4.4  CL 103 106  CO2 26 23  GLUCOSE 167* 240*  BUN 19 14  CREATININE 1.31* 1.23  CALCIUM 9.4 9.3   Liver Function Tests:  Recent Labs Lab 12/18/14 1656 12/19/14 0426  AST 319* 203*  ALT 833* 663*  ALKPHOS 436* 388*  BILITOT 5.2* 5.2*  PROT 6.5 6.4*  ALBUMIN 3.4* 3.2*    Recent  Labs Lab 12/18/14 1656  LIPASE 119*   No results for input(s): AMMONIA in the last 168 hours. CBC:  Recent Labs Lab 12/18/14 1656 12/19/14 0426  WBC 5.2 5.7  HGB 12.3* 11.6*  HCT 36.2* 34.8*  MCV 92.8 92.6  PLT 245 238   Cardiac Enzymes:   No results for input(s): CKTOTAL, CKMB, CKMBINDEX, TROPONINI in the last 168 hours. BNP (last 3 results) No results for input(s): BNP in the last 8760 hours.  ProBNP (last 3 results) No results for input(s): PROBNP in the last 8760 hours.  CBG:  Recent Labs Lab 12/19/14 0731 12/19/14 1213 12/19/14 1726  GLUCAP 182* 167* 160*    No results found for this or any previous visit (from the past 240 hour(s)).   Studies: Dg Ercp Biliary & Pancreatic Ducts  12/19/2014   CLINICAL DATA:  ERCP with sphincterotomy removal of large biliary stone  EXAM: ERCP  TECHNIQUE: Multiple spot images obtained with the fluoroscopic device and submitted for interpretation post-procedure.  COMPARISON:  Abdominal ultrasound -12/04/2014  FINDINGS: Five spot intraoperative fluoroscopic images of the right upper abdominal quadrant during ERCP are provided for review.  Initial image demonstrates an ERCP probe overlying the right upper abdominal quadrant. Surgical clips overlie the expected location of the gallbladder fossa.  There is selective cannulation and opacification of the CBD. There is an ill-defined persistent filling defect within  the distal aspect of the CBD. This finding is associated with mild dilatation of the common bile duct.  Subsequent images demonstrate insufflation of a biliary angioplasty balloon within the central aspect of the CBD with subsequent biliary sweeping and presumed sphincterotomy.  There is minimal opacification of the cystic duct. There is minimal opacification of the intrahepatic biliary tree which appears nondilated. There is no definitive opacification of the pancreatic duct.  IMPRESSION: ERCP with findings suggestive of  choledocholithiasis with subsequent biliary sweeping and presumed sphincterotomy as detailed above.  These images were submitted for radiologic interpretation only. Please see the procedural report for the amount of contrast and the fluoroscopy time utilized.   Electronically Signed   By: Simonne ComeJohn  Watts M.D.   On: 12/19/2014 16:28    Scheduled Meds: . amLODipine  10 mg Oral QPM  . enoxaparin (LOVENOX) injection  40 mg Subcutaneous Q24H  . feeding supplement (RESOURCE BREEZE)  1 Container Oral TID BM  . insulin aspart  0-9 Units Subcutaneous TID WC  . lisinopril  20 mg Oral BID  . multivitamin with minerals  1 tablet Oral Daily  . pantoprazole  40 mg Oral Daily    Continuous Infusions:    Time spent: 25mins  Aiman Sonn MD, PhD  [facility] Pager [phone]. If 7PM-7AM, please contact night-coverage at [email or web address], password [email or web address] 12/19/2014, 6:30 PM

## 2014-12-19 NOTE — Anesthesia Preprocedure Evaluation (Addendum)
Anesthesia Evaluation  Patient identified by MRN, date of birth, ID band Patient awake    Reviewed: Allergy & Precautions, NPO status , Patient's Chart, lab work & pertinent test results  Airway Mallampati: II  TM Distance: >3 FB Neck ROM: Full    Dental   Pulmonary former smoker,  breath sounds clear to auscultation        Cardiovascular hypertension, Pt. on medications Rhythm:Regular Rate:Normal     Neuro/Psych negative neurological ROS     GI/Hepatic GERD-  ,CBD stone with obstructive jaundice   Endo/Other  diabetes, Type 2, Oral Hypoglycemic Agents  Renal/GU      Musculoskeletal   Abdominal   Peds  Hematology  (+) anemia ,   Anesthesia Other Findings   Reproductive/Obstetrics                            Anesthesia Physical Anesthesia Plan  ASA: III  Anesthesia Plan: General   Post-op Pain Management:    Induction: Intravenous  Airway Management Planned: Oral ETT  Additional Equipment:   Intra-op Plan:   Post-operative Plan: Extubation in OR  Informed Consent: I have reviewed the patients History and Physical, chart, labs and discussed the procedure including the risks, benefits and alternatives for the proposed anesthesia with the patient or authorized representative who has indicated his/her understanding and acceptance.   Dental advisory given  Plan Discussed with: CRNA  Anesthesia Plan Comments:         Anesthesia Quick Evaluation

## 2014-12-19 NOTE — Op Note (Signed)
[facility] [address] [place], [id number]   ERCP PROCEDURE REPORT        EXAM DATE: 12/19/2014  PATIENT NAME:          Mitchell Pham, Mitchell Pham          MR #: [id number] BIRTHDATE:       1946/01/14     VISIT #:     [id number] ATTENDING:     Dorena CookeyJohn Josuha Fontanez, MD     STATUS:     outpatient ASSISTANT:      Omelia Blackwaterarpenter, Shelby and Nilsa NuttingAkande, Felicia   INDICATIONS:  The patient is a 69 yr old male here for an ERCP due to PROCEDURE PERFORMED:     ERCP with sphincterotomy and stone extraction MEDICATIONS:     general anesthesia  CONSENT: The patient understands the risks and benefits of the procedure and understands that these risks include, but are not limited to: sedation, allergic reaction, infection, perforation and/or bleeding. Alternative means of evaluation and treatment include, among others: physical exam, x-rays, and/or surgical intervention. The patient elects to proceed with this endoscopic procedure.  DESCRIPTION OF PROCEDURE: During intra-op preparation period all mechanical & medical equipment was checked for proper function. Hand hygiene and appropriate measures for infection prevention was taken. After the risks, benefits and alternatives of the procedure were thoroughly explained, Informed was verified, confirmed and timeout was successfully executed by the treatment team. With the patient in left semi-prone position, medications were administered intravenously.The Pentax Ercp Scope [id number] was passed from the mouth into the esophagus and further advanced from the esophagus into the stomach. From stomach scope was directed to the papilla of Vater     .  Major papilla was aligned with the duodenoscope. The scope position was confirmed fluoroscopically. Rest of the findings/therapeutics are given below. The scope was then completely withdrawn from the patient and the procedure completed. The pulse, BP, and O2 saturation were monitored and  documented by the physician and the nursing staff throughout the entire procedure. The patient was cared for as planned according to standard protocol. The patient was then discharged to recovery in stable condition and with appropriate post procedure care. Estimated blood loss is zero unless otherwise noted in this procedure report.  the papilla had a somewhat enlarged profile and was cannulated selectively with a guidewire and sphincterotome. A cholangiogram was obtained which showed a single distal stone with moderate dilatation. A generous sphincterotomy was performed and attempts to remove the stone with a 12-15 mm balloon catheter were unsuccessful. The sphincterotomy was then enlarged and with the 15-18 adjustable balloon catheter we were able to deliver a single blackish 5 mm stone. No other stones were seen on terminal cholangiogram and there was good flow through the papilla. Pancreatic duct was not entered with wire or contrast material     ADVERSE EVENT:     none immediate IMPRESSIONS:     common bile duct stone removed after sphincterotomy  RECOMMENDATIONS:     observe for complications and check liver function tests in the morning REPEAT EXAM:     when necessary   ___________________________________ Dorena CookeyJohn Mizuki Hoel, MD eSigned:  Dorena CookeyJohn Yassen Kinnett, MD 12/19/2014 3:46 PM   cc:  CPT CODES: ICD9 CODES:  The ICD and CPT codes recommended by this software are interpretations from the data that the clinical staff has captured with the software.  The verification of the translation of this report to the ICD and CPT codes and modifiers is the  sole responsibility of the health care institution and practicing physician where this report was generated.  [employer] will not be held responsible for the validity of the ICD and CPT codes included on this report.  AMA assumes no liability for data contained or not contained herein. CPT is a Publishing rights manager of  the [employer].   PATIENT NAME:  [name], [name] MR#: [id number]

## 2014-12-19 NOTE — Interval H&P Note (Signed)
History and Physical Interval Note:  12/19/2014 2:34 PM  Mitchell Pham  has presented today for surgery, with the diagnosis of Common bile duct stone  The various methods of treatment have been discussed with the patient and family. After consideration of risks, benefits and other options for treatment, the patient has consented to  Procedure(s): ENDOSCOPIC RETROGRADE CHOLANGIOPANCREATOGRAPHY (ERCP) (N/A) as a surgical intervention .  The patient's history has been reviewed, patient examined, no change in status, stable for surgery.  I have reviewed the patient's chart and labs.  Questions were answered to the patient's satisfaction.     Moria Brophy C

## 2014-12-19 NOTE — Anesthesia Procedure Notes (Signed)
Procedure Name: Intubation Date/Time: 12/19/2014 2:50 PM Performed by: Charm BargesBUTLER, Azaylah Stailey R Pre-anesthesia Checklist: Patient identified, Emergency Drugs available, Suction available, Patient being monitored and Timeout performed Patient Re-evaluated:Patient Re-evaluated prior to inductionOxygen Delivery Method: Circle system utilized Preoxygenation: Pre-oxygenation with 100% oxygen Intubation Type: IV induction Ventilation: Mask ventilation without difficulty Laryngoscope Size: Mac and 4 Grade View: Grade II Tube type: Oral Tube size: 7.5 mm Number of attempts: 1 Airway Equipment and Method: Stylet Placement Confirmation: ETT inserted through vocal cords under direct vision and positive ETCO2 Secured at: 22 cm Tube secured with: Tape Dental Injury: Teeth and Oropharynx as per pre-operative assessment

## 2014-12-19 NOTE — H&P (View-Only) (Signed)
Inpatient Diabetes Program Recommendations  AACE/ADA: New Consensus Statement on Inpatient Glycemic Control (2013)  Target Ranges:  Prepandial:   less than 140 mg/dL      Peak postprandial:   less than 180 mg/dL (1-2 hours)      Critically ill patients:  140 - 180 mg/dL    Inpatient Diabetes Program Recommendations Correction (SSI): Noted order for sensitive correction tidwc; however patient is NPO for ERCP this afternoon-unsure of po status folllowing procedure. May want to consider changing correction q 4 hrs during this time. Oral Agents: Metformin on hold while po intake is NPO or minimal  Thank you Waynetta Metheny, RN, MSN, CDE  Diabetes Inpatient Program Office: [phone] Pager: [phone] 8:00 am to 5:00 pm    

## 2014-12-19 NOTE — Progress Notes (Signed)
Nutrition Brief Note  Patient identified on the Malnutrition Screening Tool (MST) Report  Wt Readings from Last 15 Encounters:  12/18/14 217 lb (98.431 kg)   Mitchell Pham is an 69 y.o. white male with a history of cholecystectomy in December 2015 presents with intermittent right upper quadrant colicky abdominal pain with elevated LFTs and pruritus. He had an MRCP recently which showed a 3 mm common bile duct stone and presents for ERCP. He has no other complaints.  Pt reports mainly intentional weight loss over the past few months, but estimates he lost a few pounds over the past week due to decreased oral intake as a result of abdominal pain. He is scheduled for an ERCP later on today.   Nutrition-focused physical exam completed. No signs of cat or muscle depletion noted.   Body mass index is 30.28 kg/(m^2). Patient meets criteria for obesity, class I based on current BMI.   Current diet order is NPO (awaiting ERCP today), patient is consuming approximately n/a% of meals at this time. Labs and medications reviewed.   No nutrition interventions warranted at this time. If nutrition issues arise, please consult RD.   Maximo Spratling A. Mayford KnifeWilliams, RD, LDN, CDE Pager: [phone] After hours Pager: [phone]

## 2014-12-20 ENCOUNTER — Encounter (HOSPITAL_COMMUNITY): Payer: Self-pay | Admitting: Gastroenterology

## 2014-12-20 DIAGNOSIS — K831 Obstruction of bile duct: Secondary | ICD-10-CM | POA: Diagnosis not present

## 2014-12-20 DIAGNOSIS — Z9889 Other specified postprocedural states: Secondary | ICD-10-CM

## 2014-12-20 DIAGNOSIS — I10 Essential (primary) hypertension: Secondary | ICD-10-CM | POA: Diagnosis not present

## 2014-12-20 DIAGNOSIS — R748 Abnormal levels of other serum enzymes: Secondary | ICD-10-CM

## 2014-12-20 LAB — COMPREHENSIVE METABOLIC PANEL
ALT: 461 U/L — ABNORMAL HIGH (ref 17–63)
AST: 115 U/L — ABNORMAL HIGH (ref 15–41)
Albumin: 3 g/dL — ABNORMAL LOW (ref 3.5–5.0)
Alkaline Phosphatase: 345 U/L — ABNORMAL HIGH (ref 38–126)
Anion gap: 8 (ref 5–15)
BUN: 16 mg/dL (ref 6–20)
CO2: 25 mmol/L (ref 22–32)
Calcium: 9.1 mg/dL (ref 8.9–10.3)
Chloride: 103 mmol/L (ref 101–111)
Creatinine, Ser: 1.36 mg/dL — ABNORMAL HIGH (ref 0.61–1.24)
GFR calc Af Amer: >60 mL/min (ref >60)
GFR calc non Af Amer: 53 mL/min — ABNORMAL LOW (ref >60)
Glucose, Bld: 219 mg/dL — ABNORMAL HIGH (ref 65–99)
Potassium: 4.4 mmol/L (ref 3.5–5.1)
Sodium: 136 mmol/L (ref 135–145)
Total Bilirubin: 5.7 mg/dL — ABNORMAL HIGH (ref 0.3–1.2)
Total Protein: 6.4 g/dL — ABNORMAL LOW (ref 6.5–8.1)

## 2014-12-20 LAB — GLUCOSE, CAPILLARY: Glucose-Capillary: 170 mg/dL — ABNORMAL HIGH (ref 65–99)

## 2014-12-20 LAB — CBC
HCT: 34.6 % — ABNORMAL LOW (ref 39.0–52.0)
Hemoglobin: 11.6 g/dL — ABNORMAL LOW (ref 13.0–17.0)
MCH: 31.2 pg (ref 26.0–34.0)
MCHC: 33.5 g/dL (ref 30.0–36.0)
MCV: 93 fL (ref 78.0–100.0)
Platelets: 216 10*3/uL (ref 150–400)
RBC: 3.72 MIL/uL — ABNORMAL LOW (ref 4.22–5.81)
RDW: 15.1 % (ref 11.5–15.5)
WBC: 4.8 10*3/uL (ref 4.0–10.5)

## 2014-12-20 LAB — URINALYSIS, ROUTINE W REFLEX MICROSCOPIC
Glucose, UA: NEGATIVE mg/dL
Hgb urine dipstick: NEGATIVE
Ketones, ur: 15 mg/dL — AB
Nitrite: POSITIVE — AB
Protein, ur: NEGATIVE mg/dL
Specific Gravity, Urine: 1.016 (ref 1.005–1.030)
Urobilinogen, UA: 1 mg/dL (ref 0.0–1.0)
pH: 5 (ref 5.0–8.0)

## 2014-12-20 LAB — LIPASE, BLOOD: Lipase: 73 U/L — ABNORMAL HIGH (ref 22–51)

## 2014-12-20 LAB — URINE MICROSCOPIC-ADD ON

## 2014-12-20 NOTE — Progress Notes (Signed)
Reviewed AVS discharge instructions with patient. Patient stated that he did not have any questions. Patient ambulated with his wife to their transportation.

## 2014-12-20 NOTE — Discharge Summary (Signed)
Discharge Summary  Mitchell Fortesed R Towle ZOX:[id number] DOB: Jan 16, 1946  PCP: Neldon LabellaMILLER,LISA LYNN, MD  Admit date: 12/18/2014 Discharge date: 12/20/2014  Time spent: <7430mins  Recommendations for Outpatient Follow-up:  1. F/u with PMD within a month, pmd to continue monitor renal function. 2. [facility] Madilyn FiremanHayes on 7/11 to repeat labs, f/u Dr. Bosie Closschooler on 7/15, s/p ERCP  Discharge Diagnoses:  Active Hospital Problems   Diagnosis Date Noted  . Common bile duct (CBD) obstruction 12/18/2014  . RUQ pain 12/18/2014  . Pruritus 12/18/2014  . Hypertension 12/18/2014  . DM type 2 (diabetes mellitus, type 2) 12/18/2014    Resolved Hospital Problems   Diagnosis Date Noted Date Resolved  No resolved problems to display.    Discharge Condition: stable  Diet recommendation: heart healthy/carb modified  Filed Weights   12/18/14 1444 12/18/14 2012  Weight: 98.431 kg (217 lb) 98.431 kg (217 lb)    History of present illness:  Mitchell Pham is a 69 y.o. male with hx of HTN and mild DM type 2. He had cholecystectomy in 12/ 15 uncomplicated. Recently was found to have jaundice in conjunction with intermittent bouts of RUQ pain. Workup last week with US at outside facility showed common bile duct obstruction and he is felt to have a retained stone and in need of ERCP. GI has requested admission for this reason.   Patient describes intermittent RUQ pain episodes lasting for hours. Also some indigestion and nausea, no vomiting or diarrhea. No fevers or chills. No CP, SOB or cough.  Hospital Course:  Principal Problem:   Common bile duct (CBD) obstruction Active Problems:   RUQ pain   Pruritus   Hypertension   DM type 2 (diabetes mellitus, type 2)  Obstructed CBD stone s/p ERCP with sphincterotomy and stone extraction,  Feeling much better, no pain, no n/v, tolerating diet expecting lft/lipase continue to trend down. Repeat labs on 7/11 per GI Dr Madilyn FiremanHayes  noninsulin  dependent diabetes: reported last a1c was between 5-6, metformin held in the hospital, resume at discharge.  Htn: well controlled on norvasc, lisinopril. Continue all home meds at discharge.  Elevated  Cr: ua no infection. Possibly due to dehydration, encourage oral intake, pmd to continue monitor cr.  Code Status: full  Family Communication: patient and wife in room  Disposition Plan: home   Consultants:  [facility]  Procedures: ERCP with sphincterotomy and stone extraction 7/6  Antibiotics:  none  Discharge Exam: BP 113/62 mmHg  Pulse 61  Temp(Src) 98.4 F (36.9 C) (Oral)  Resp 18  Ht 5\' 11"  (1.803 m)  Wt 98.431 kg (217 lb)  BMI 30.28 kg/m2  SpO2 99%  General: AAOx3 Cardiovascular: RRR Respiratory: CTABL  Discharge Instructions You were cared for by a hospitalist during your hospital stay. If you have any questions about your discharge medications or the care you received while you were in the hospital after you are discharged, you can call the unit and asked to speak with the hospitalist on call if the hospitalist that took care of you is not available. Once you are discharged, your primary care physician will handle any further medical issues. Please note that NO REFILLS for any discharge medications will be authorized once you are discharged, as it is imperative that you return to your primary care physician (or establish a relationship with a primary care physician if you do not have one) for your aftercare needs so that they can reassess your need for medications and monitor  your lab values.      Discharge Instructions    Diet - low sodium heart healthy    Complete by:  As directed      Increase activity slowly    Complete by:  As directed             Medication List    TAKE these medications        allopurinol 300 MG tablet  Commonly known as:  ZYLOPRIM  Take 300 mg by mouth 2 (two) times daily.     amLODipine 10 MG tablet  Commonly known as:   NORVASC  Take 10 mg by mouth every evening.     aspirin 81 MG tablet  Take 81 mg by mouth daily.     atorvastatin 10 MG tablet  Commonly known as:  LIPITOR  Take 10 mg by mouth daily.     lisinopril 20 MG tablet  Commonly known as:  PRINIVIL,ZESTRIL  Take 20 mg by mouth 2 (two) times daily.     metFORMIN 500 MG tablet  Commonly known as:  GLUCOPHAGE  Take 1,000 mg by mouth 2 (two) times daily with a meal.     multivitamin with minerals Tabs tablet  Take 1 tablet by mouth daily.     omeprazole 20 MG capsule  Commonly known as:  PRILOSEC  Take 20 mg by mouth See admin instructions. Take 1 tablet by mouth only on Tuesday, Thursdays and saturdays     OSTEO BI-FLEX JOINT SHIELD PO  Take 1 tablet by mouth 2 (two) times daily.     potassium chloride SA 20 MEQ tablet  Commonly known as:  K-DUR,KLOR-CON  Take 20 mEq by mouth daily.       No Known Allergies Follow-up Information    Follow up with Neldon Labella, MD In 1 month.   Specialty:  Family Medicine   Why:  monitor cr level   Contact information:   [address] [place] [address] [phone]       Follow up with Shirley Friar., MD On 12/28/2014.   Specialty:  Gastroenterology   Why:  cbd stone s/p ercp, repeat cmp   Contact information:   [address] [place] [address] [phone]       Follow up with Barrie Folk, MD On 12/24/2014.   Specialty:  Gastroenterology   Why:  s/p ERCP, repeat cbc/cmp/lipase   Contact information:   [address] [place] [address] [phone]        The results of significant diagnostics from this hospitalization (including imaging, microbiology, ancillary and laboratory) are listed below for reference.    Significant Diagnostic Studies: US Abdomen Complete  12/04/2014   CLINICAL DATA:  Abdominal pain for 5 days.  Elevated LFTs.  EXAM: ULTRASOUND ABDOMEN COMPLETE  COMPARISON:  10/09/2013  FINDINGS: Gallbladder: Prior  cholecystectomy  Common bile duct: Diameter: Normal caliber, 6-7 mm.  Liver: Diffusely increased echotexture throughout the liver compatible with fatty infiltration. No focal abnormality.  IVC: No abnormality visualized.  Pancreas: Visualized portion unremarkable.  Spleen: Size and appearance within normal limits.  Right Kidney: Length: 12.3 cm. Echogenicity within normal limits. No mass or hydronephrosis visualized.  Left Kidney: Length: 12.3 cm. Echogenicity within normal limits. No mass or hydronephrosis visualized.  Abdominal aorta: No aneurysm visualized.  Other findings: None.  IMPRESSION: Fatty liver.  No acute findings.  Prior cholecystectomy.   Electronically Signed   By: Charlett Nose M.D.  On: 12/04/2014 09:35   Dg Ercp Biliary & Pancreatic Ducts  12/19/2014   CLINICAL DATA:  ERCP with sphincterotomy removal of large biliary stone  EXAM: ERCP  TECHNIQUE: Multiple spot images obtained with the fluoroscopic device and submitted for interpretation post-procedure.  COMPARISON:  Abdominal ultrasound -12/04/2014  FINDINGS: Five spot intraoperative fluoroscopic images of the right upper abdominal quadrant during ERCP are provided for review.  Initial image demonstrates an ERCP probe overlying the right upper abdominal quadrant. Surgical clips overlie the expected location of the gallbladder fossa.  There is selective cannulation and opacification of the CBD. There is an ill-defined persistent filling defect within the distal aspect of the CBD. This finding is associated with mild dilatation of the common bile duct.  Subsequent images demonstrate insufflation of a biliary angioplasty balloon within the central aspect of the CBD with subsequent biliary sweeping and presumed sphincterotomy.  There is minimal opacification of the cystic duct. There is minimal opacification of the intrahepatic biliary tree which appears nondilated. There is no definitive opacification of the pancreatic duct.  IMPRESSION: ERCP with  findings suggestive of choledocholithiasis with subsequent biliary sweeping and presumed sphincterotomy as detailed above.  These images were submitted for radiologic interpretation only. Please see the procedural report for the amount of contrast and the fluoroscopy time utilized.   Electronically Signed   By: Simonne Come M.D.   On: 12/19/2014 16:28    Microbiology: No results found for this or any previous visit (from the past 240 hour(s)).   Labs: Basic Metabolic Panel:  Recent Labs Lab 12/18/14 1656 12/19/14 0426 12/20/14 0432  NA 138 137 136  K 4.4 4.4 4.4  CL 103 106 103  CO2 26 23 25   GLUCOSE 167* 240* 219*  BUN 19 14 16   CREATININE 1.31* 1.23 1.36*  CALCIUM 9.4 9.3 9.1   Liver Function Tests:  Recent Labs Lab 12/18/14 1656 12/19/14 0426 12/20/14 0432  AST 319* 203* 115*  ALT 833* 663* 461*  ALKPHOS 436* 388* 345*  BILITOT 5.2* 5.2* 5.7*  PROT 6.5 6.4* 6.4*  ALBUMIN 3.4* 3.2* 3.0*    Recent Labs Lab 12/18/14 1656 12/20/14 0432  LIPASE 119* 73*   No results for input(s): AMMONIA in the last 168 hours. CBC:  Recent Labs Lab 12/18/14 1656 12/19/14 0426 12/20/14 0432  WBC 5.2 5.7 4.8  HGB 12.3* 11.6* 11.6*  HCT 36.2* 34.8* 34.6*  MCV 92.8 92.6 93.0  PLT 245 238 216   Cardiac Enzymes: No results for input(s): CKTOTAL, CKMB, CKMBINDEX, TROPONINI in the last 168 hours. BNP: BNP (last 3 results) No results for input(s): BNP in the last 8760 hours.  ProBNP (last 3 results) No results for input(s): PROBNP in the last 8760 hours.  CBG:  Recent Labs Lab 12/19/14 0731 12/19/14 1213 12/19/14 1726 12/19/14 2127 12/20/14 0744  GLUCAP 182* 167* 160* 262* 170*       Signed:  Demeshia Sherburne MD, PhD  [facility] 12/20/2014, 10:42 AM

## 2014-12-21 LAB — HEMOGLOBIN A1C
Hgb A1c MFr Bld: 6.3 % — ABNORMAL HIGH (ref 4.8–5.6)
Mean Plasma Glucose: 134 mg/dL

## 2014-12-21 NOTE — Anesthesia Postprocedure Evaluation (Signed)
  Anesthesia Post-op Note  Patient: Mitchell Pham  Procedure(s) Performed: Procedure(s): ENDOSCOPIC RETROGRADE CHOLANGIOPANCREATOGRAPHY (ERCP) (N/A)  Patient Location: PACU  Anesthesia Type:General  Level of Consciousness: awake, alert  and oriented  Airway and Oxygen Therapy: Patient Spontanous Breathing  Post-op Pain: none  Post-op Assessment: Post-op Vital signs reviewed              Post-op Vital Signs: Reviewed  Last Vitals:  Filed Vitals:   12/20/14 0505  BP: 113/62  Pulse: 61  Temp: 36.9 C  Resp: 18    Complications: No apparent anesthesia complications

## 2016-05-09 ENCOUNTER — Emergency Department
Admission: EM | Admit: 2016-05-09 | Discharge: 2016-05-09 | Disposition: A | Discharge status: Home / Self Care | Payer: Medicare Other | Attending: Student in an Organized Health Care Education/Training Program | Admitting: Student in an Organized Health Care Education/Training Program

## 2016-05-09 ENCOUNTER — Encounter: Payer: Self-pay | Admitting: Emergency Medicine

## 2016-05-09 DIAGNOSIS — Y9389 Activity, other specified: Secondary | ICD-10-CM | POA: Insufficient documentation

## 2016-05-09 DIAGNOSIS — Z87891 Personal history of nicotine dependence: Secondary | ICD-10-CM | POA: Insufficient documentation

## 2016-05-09 DIAGNOSIS — M5442 Lumbago with sciatica, left side: Secondary | ICD-10-CM | POA: Diagnosis not present

## 2016-05-09 DIAGNOSIS — Z7984 Long term (current) use of oral hypoglycemic drugs: Secondary | ICD-10-CM | POA: Diagnosis not present

## 2016-05-09 DIAGNOSIS — Z79899 Other long term (current) drug therapy: Secondary | ICD-10-CM | POA: Diagnosis not present

## 2016-05-09 DIAGNOSIS — Z7982 Long term (current) use of aspirin: Secondary | ICD-10-CM | POA: Insufficient documentation

## 2016-05-09 DIAGNOSIS — E119 Type 2 diabetes mellitus without complications: Secondary | ICD-10-CM | POA: Diagnosis not present

## 2016-05-09 DIAGNOSIS — X501XXA Overexertion from prolonged static or awkward postures, initial encounter: Secondary | ICD-10-CM | POA: Insufficient documentation

## 2016-05-09 DIAGNOSIS — I10 Essential (primary) hypertension: Secondary | ICD-10-CM | POA: Diagnosis not present

## 2016-05-09 DIAGNOSIS — Y990 Civilian activity done for income or pay: Secondary | ICD-10-CM | POA: Insufficient documentation

## 2016-05-09 DIAGNOSIS — M545 Low back pain: Secondary | ICD-10-CM | POA: Diagnosis present

## 2016-05-09 DIAGNOSIS — Y929 Unspecified place or not applicable: Secondary | ICD-10-CM | POA: Insufficient documentation

## 2016-05-09 MED ORDER — CYCLOBENZAPRINE HCL 10 MG PO TABS: 10.0000 mg | ORAL_TABLET | Freq: Three times a day (TID) | ORAL | 0 refills | Status: AC | PRN

## 2016-05-09 MED ORDER — KETOROLAC TROMETHAMINE 30 MG/ML IJ SOLN
15.0000 mg | Freq: Once | INTRAMUSCULAR | Status: AC
  Administered 2016-05-09: 15 mg via INTRAMUSCULAR
  Filled 2016-05-09: qty 1

## 2016-05-09 MED ORDER — BUPIVACAINE HCL (PF) 0.5 % IJ SOLN
30.0000 mL | Freq: Once | INTRAMUSCULAR | Status: AC
  Administered 2016-05-09: 30 mL
  Filled 2016-05-09: qty 30

## 2016-05-09 MED ORDER — TRAMADOL HCL 50 MG PO TABS
50.0000 mg | ORAL_TABLET | Freq: Four times a day (QID) | ORAL | 0 refills | Status: AC | PRN
Start: 2016-05-09 — End: 2017-05-09

## 2016-05-09 NOTE — ED Provider Notes (Signed)
[facility] Emergency Department Provider Note    First MD Initiated Contact with Patient 05/09/16 [phone]     (approximate)  I have reviewed the triage vital signs and the nursing notes.   HISTORY  Chief Complaint Back Pain    HPI Mitchell Pham is a 70 y.o. male with a history of diabetes, hypertension and GERD presents with 3 days of low back pain radiating down his left leg after working on [employer] project.  Patient states that it's similar previous episodes of low back pain with sciatica that he's had ever since he got in a skiing accident several years ago. He denies any difficulty urinating or moving his bowels. No saddle anesthesia. No weakness or difficulty with ambulating other than just the associated pain which she currently rates as 7 out of 10 in severity. Denies any fevers. No IV drug abuse.   Past Medical History:  Diagnosis Date  . GERD (gastroesophageal reflux disease)   . Heart murmur    "told I have one once"  . Hypercholesterolemia   . Hypertension   . Type II diabetes mellitus (HCC)    No family history on file. Past Surgical History:  Procedure Laterality Date  . ERCP N/A 12/19/2014   Procedure: ENDOSCOPIC RETROGRADE CHOLANGIOPANCREATOGRAPHY (ERCP);  Surgeon: Dorena CookeyJohn Hayes, MD;  [facility];  Service: Endoscopy;  Laterality: N/A;  . KNEE ARTHROSCOPY Left ~ 1988  . LAPAROSCOPIC ABDOMINAL EXPLORATION  02/2014   "tried to take my gallbladder out; it was too inflamed"  . LAPAROSCOPIC CHOLECYSTECTOMY  05/2014  . TONSILLECTOMY AND ADENOIDECTOMY  ~ 1955   Patient Active Problem List   Diagnosis Date Noted  . RUQ pain 12/18/2014  . Common bile duct (CBD) obstruction 12/18/2014  . Pruritus 12/18/2014  . Hypertension 12/18/2014  . DM type 2 (diabetes mellitus, type 2) (HCC) 12/18/2014  . Obstructive jaundice       Prior to Admission medications   Medication Sig Start Date End Date Taking? Authorizing Provider    allopurinol (ZYLOPRIM) 300 MG tablet Take 300 mg by mouth 2 (two) times daily.    Historical Provider, MD  amLODipine (NORVASC) 10 MG tablet Take 10 mg by mouth every evening.    Historical Provider, MD  aspirin 81 MG tablet Take 81 mg by mouth daily.    Historical Provider, MD  atorvastatin (LIPITOR) 10 MG tablet Take 10 mg by mouth daily.    Historical Provider, MD  lisinopril (PRINIVIL,ZESTRIL) 20 MG tablet Take 20 mg by mouth 2 (two) times daily.    Historical Provider, MD  metFORMIN (GLUCOPHAGE) 500 MG tablet Take 1,000 mg by mouth 2 (two) times daily with a meal.     Historical Provider, MD  Misc Natural Products (OSTEO BI-FLEX JOINT SHIELD PO) Take 1 tablet by mouth 2 (two) times daily.    Historical Provider, MD  Multiple Vitamin (MULTIVITAMIN WITH MINERALS) TABS tablet Take 1 tablet by mouth daily.    Historical Provider, MD  omeprazole (PRILOSEC) 20 MG capsule Take 20 mg by mouth See admin instructions. Take 1 tablet by mouth only on Tuesday, Thursdays and saturdays    Historical Provider, MD  potassium chloride SA (K-DUR,KLOR-CON) 20 MEQ tablet Take 20 mEq by mouth daily.    Historical Provider, MD    Allergies Patient has no known allergies.    Social History Social History  Substance Use Topics  . Smoking status: Former Smoker    Packs/day: 2.00    Years: 20.00  Types: Cigarettes    Quit date: 03/15/1986  . Smokeless tobacco: Never Used  . Alcohol use 3.6 oz/week    6 Glasses of wine per week    Review of Systems Patient denies headaches, rhinorrhea, blurry vision, numbness, shortness of breath, chest pain, edema, cough, abdominal pain, nausea, vomiting, diarrhea, dysuria, fevers, rashes or hallucinations unless otherwise stated above in HPI. ____________________________________________   PHYSICAL EXAM:  VITAL SIGNS: Vitals:   05/09/16 0351 05/09/16 0547  BP: (!) 151/80 139/73  Pulse: 71 65  Resp: 20 18  Temp: 97.8 F (36.6 C)     Constitutional: Alert  and oriented. Well appearing and in no acute distress. Eyes: Conjunctivae are normal. PERRL. EOMI. Head: Atraumatic. Nose: No congestion/rhinnorhea. Mouth/Throat: Mucous membranes are moist.  Oropharynx non-erythematous. Neck: No stridor. Painless ROM. No cervical spine tenderness to palpation Hematological/Lymphatic/Immunilogical: No cervical lymphadenopathy. Cardiovascular: Normal rate, regular rhythm. Grossly normal heart sounds.  Good peripheral circulation. Respiratory: Normal respiratory effort.  No retractions. Lungs CTAB. Gastrointestinal: Soft and nontender. No distention. No abdominal bruits. No CVA tenderness.  Musculoskeletal: No lower extremity tenderness nor edema.  No joint effusions. Neurologic:  Normal speech and language. No gross focal neurologic deficits are appreciated. No gait instability. Skin:  Skin is warm, dry and intact. No rash noted. Psychiatric: Mood and affect are normal. Speech and behavior are normal.  ____________________________________________   LABS (all labs ordered are listed, but only abnormal results are displayed)  No results found for this or any previous visit (from the past 24 hour(s)). ____________________________________________  EKG____________________________________________  RADIOLOGY   ____________________________________________   PROCEDURES  Procedure(s) performed: none Procedures    Critical Care performed: no ____________________________________________   INITIAL IMPRESSION / ASSESSMENT AND PLAN / ED COURSE  Pertinent labs & imaging results that were available during my care of the patient were reviewed by me and considered in my medical decision making (see chart for details).  DDX: sciatica, neuralgia paresthetica, msk strain, stone  Mitchell Pham is a 70 y.o. who presents to the ED with No recent history of injury or trauma. No recent back instrumentation/procedures. No fevers. Denies cord compression  symptoms. No bowel/bladder incontinence or retention, no LE weakness. VSS in ED. Exam with no LE weakness bilat., no sensory deficits, normal DTRs, no clonus, no saddle anesthesia. Pain with palpation of back and with trunk movement. Likely MSK related pain, probable lumbar strain.Clinical picture is not consistent with epidural abscess , fracture, or cauda equina syndrome. Treatments will include observation, analgesia, and arrange appropriate follow up for recheck.  Plan supportive care, follow up for recheck.     Clinical Course as of May 09 833  Sat May 09, 2016  [id number] Procedure: Trigger point injection. The patient agreed to the procedure without reservation, and acknowledging the risks of infection, nerve damage, damage to internal organs, lack of efficacy, bleeding. The left lower lumbar region was prepped with chloraprep and allowed to dry. A 10 cc syringe was loaded with 0.5% Bupivacaine and a 27 ga needle advanced toward the areas of discomfort The plunger was withdrawn before injection. The patient tolerated the procedure well.   [PR]    Clinical Course User Index [PR] Willy EddyPatrick Kellyn Mccary, MD   ----------------------------------------- 8:35 AM on 05/09/2016 -----------------------------------------  History and physical exam less consistent with kidney stone or pyelonephritis. Patient was significant improvement in symptoms after trigger point injection. Able to and related with steady gait. Will discharge home with supportive care and discuss indications for which she should  return to the Er.  Have discussed with the patient and available family all diagnostics and treatments performed thus far and all questions were answered to the best of my ability. The patient demonstrates understanding and agreement with plan.    ____________________________________________   FINAL CLINICAL IMPRESSION(S) / ED DIAGNOSES  Final diagnoses:  Acute left-sided low back pain with left-sided  sciatica      NEW MEDICATIONS STARTED DURING THIS VISIT:  New Prescriptions   No medications on file     Note:  This document was prepared using Dragon voice recognition software and may include unintentional dictation errors.    Willy Eddy, MD 05/09/16 [phone]

## 2016-05-09 NOTE — Discharge Instructions (Signed)

## 2016-05-09 NOTE — ED Triage Notes (Signed)
Pt states that he has been experiencing back pain that has radiated down his left leg since Wednesday. Pt denies trauma at this time but states that he has been working on a big project which has required him to be in a hunched position. Pt is ambulatory to triage with NAD noted at this time.

## 2016-05-09 NOTE — ED Notes (Signed)
Pt reports low back pain, left sided, radiating down left leg x 3 days.  Pt reports hx back pain.  Denies urinary sx.  Pt NAD at this time

## 2016-11-05 IMAGING — US US ABDOMEN COMPLETE
1 series · 14 of 25 positions shown · non-contrast
Comparison: 10/09/2013

CLINICAL DATA: Abdominal pain for 5 days.  Elevated LFTs.

EXAM:
ULTRASOUND ABDOMEN COMPLETE

[Series 1: us abdomen complete · 0.45mm/px · 14 of 66 slices shown]
[im 1/66]
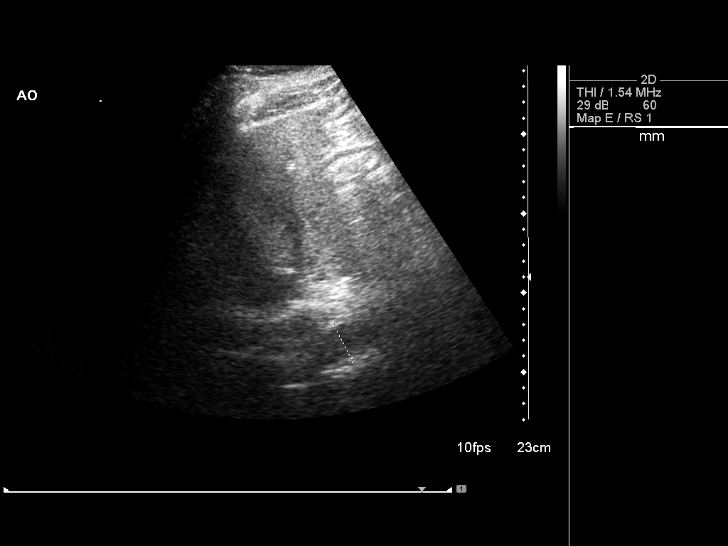
[im 6/66]
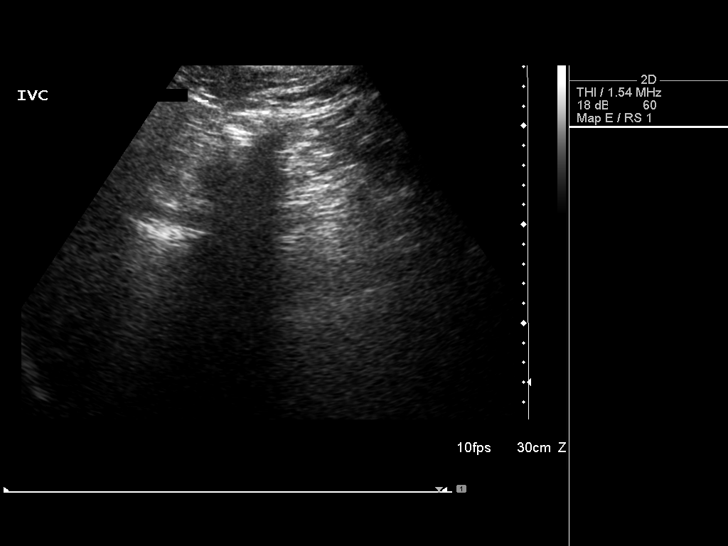
[im 11/66]
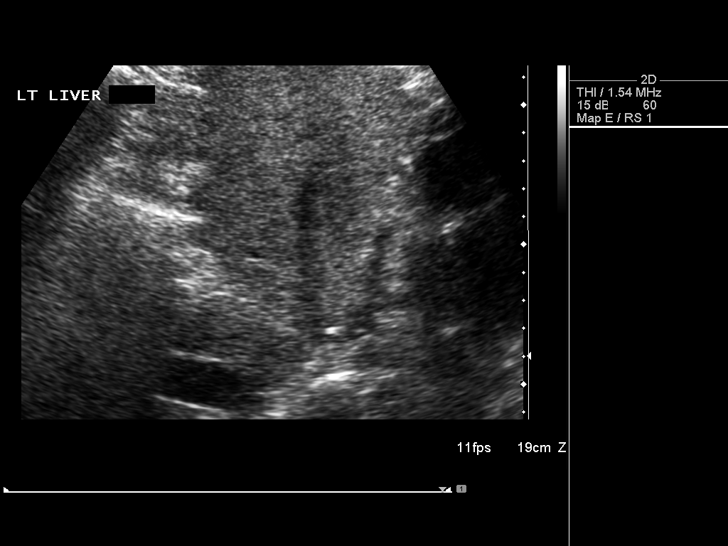
[im 17/66]
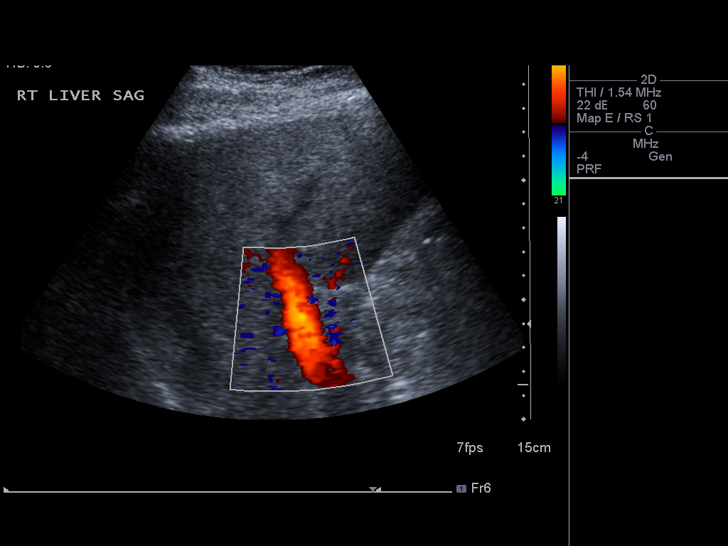
[im 22/66]
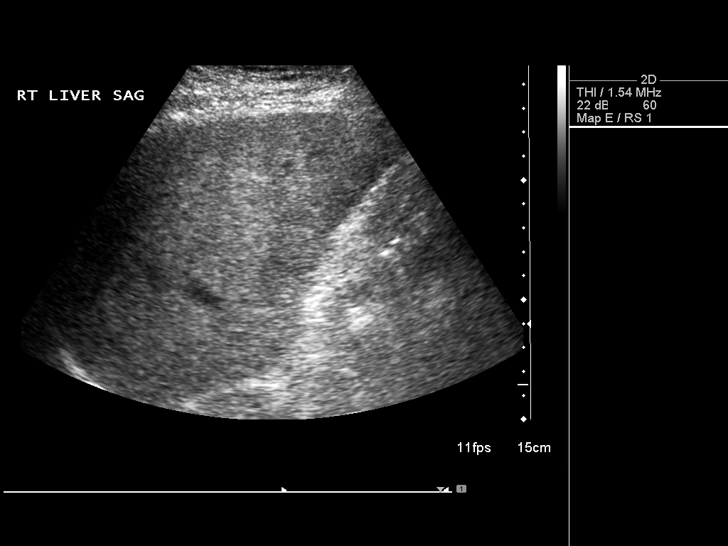
[im 25/66]
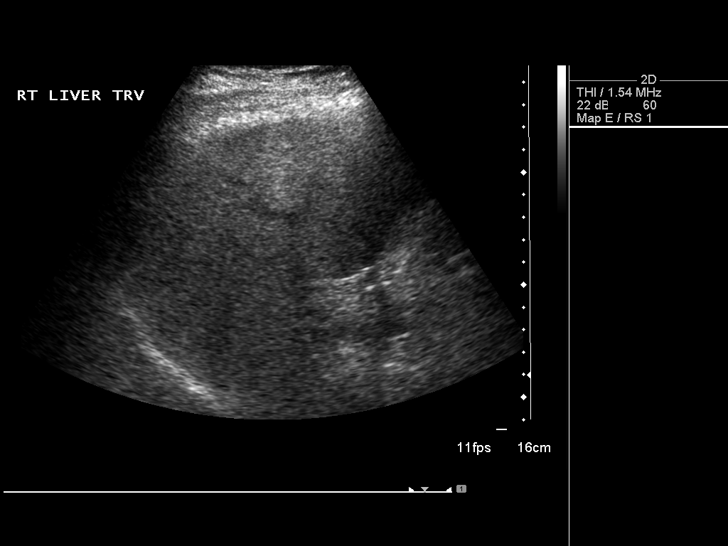
[im 30/66]
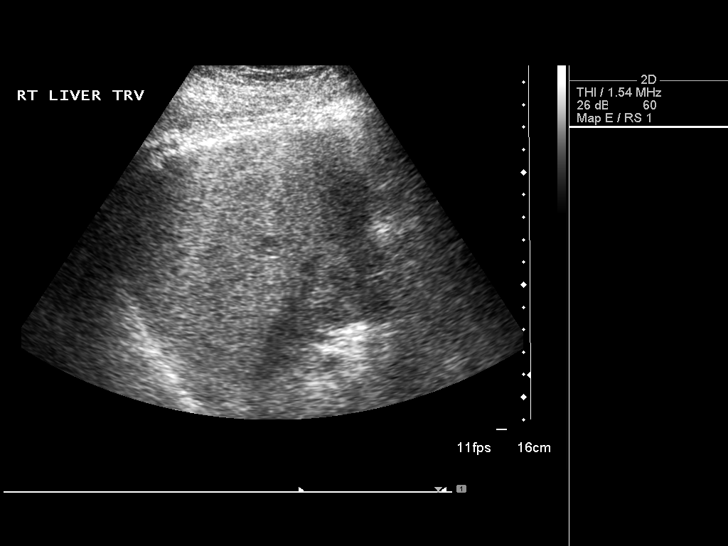
[im 36/66]
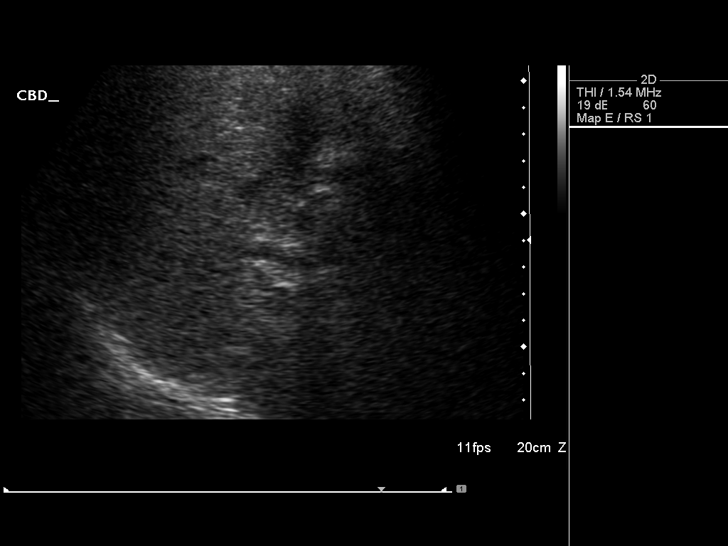
[im 41/66]
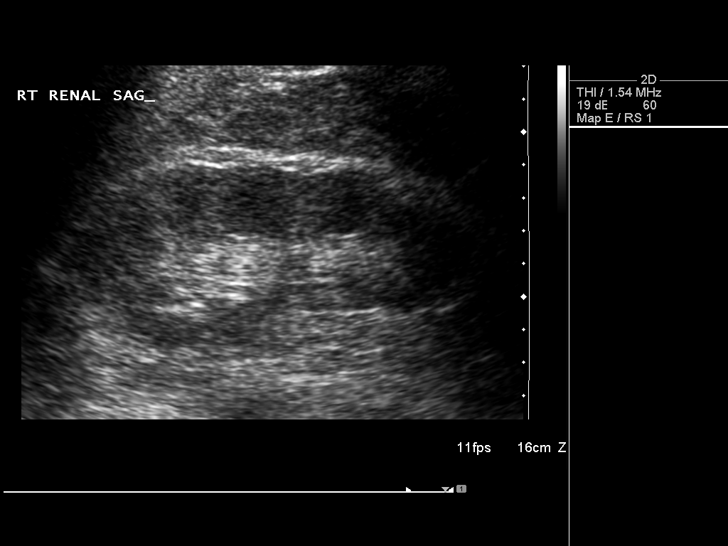
[im 44/66]
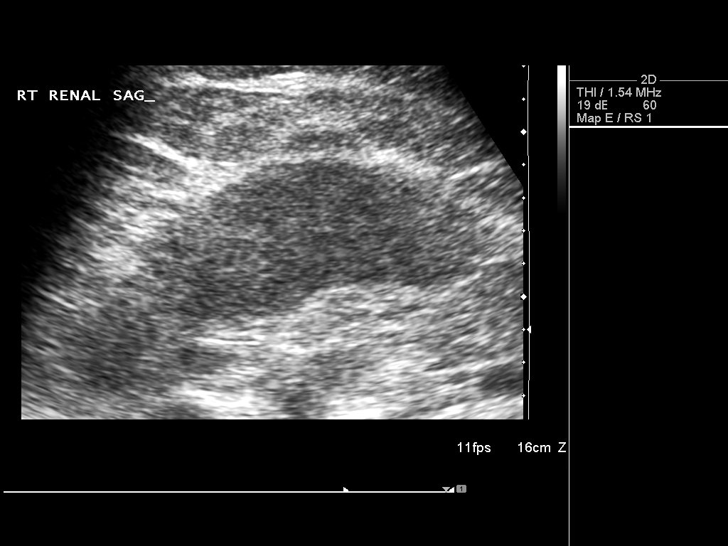
[im 49/66]
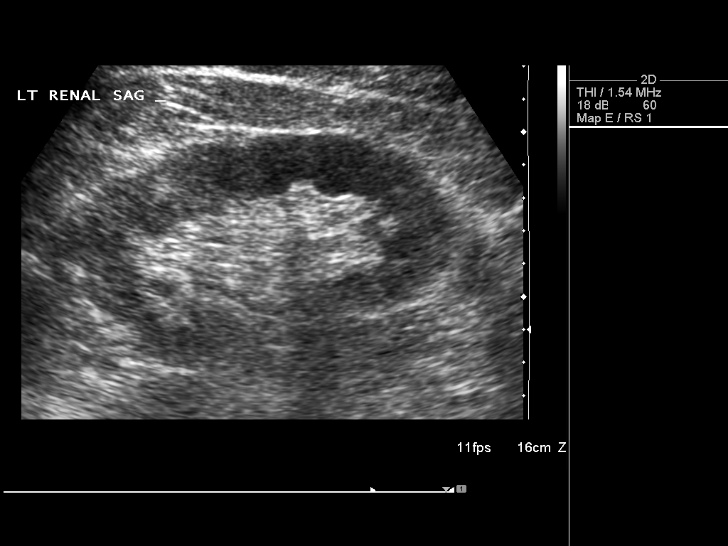
[im 55/66]
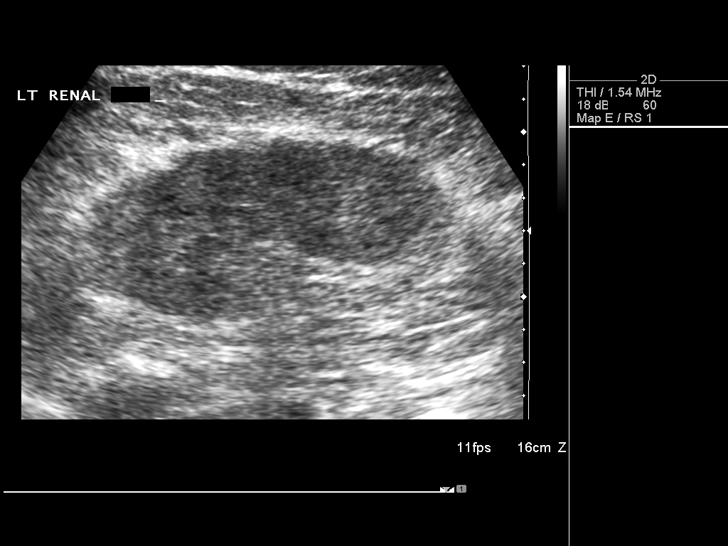
[im 60/66]
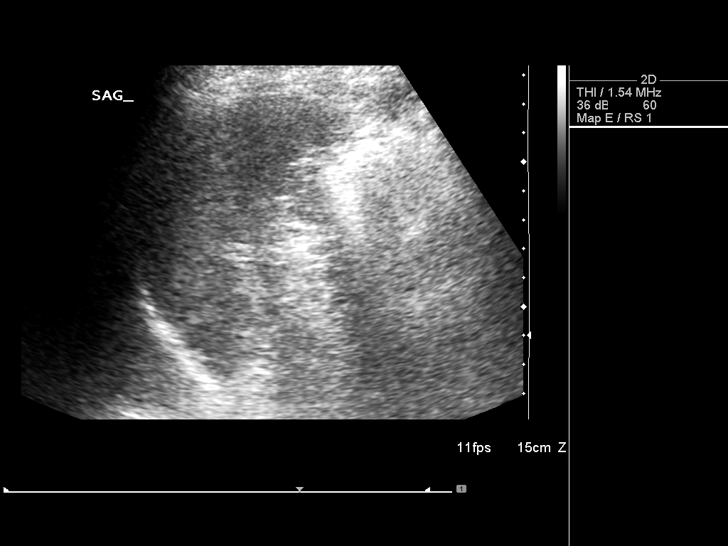
[im 66/66]
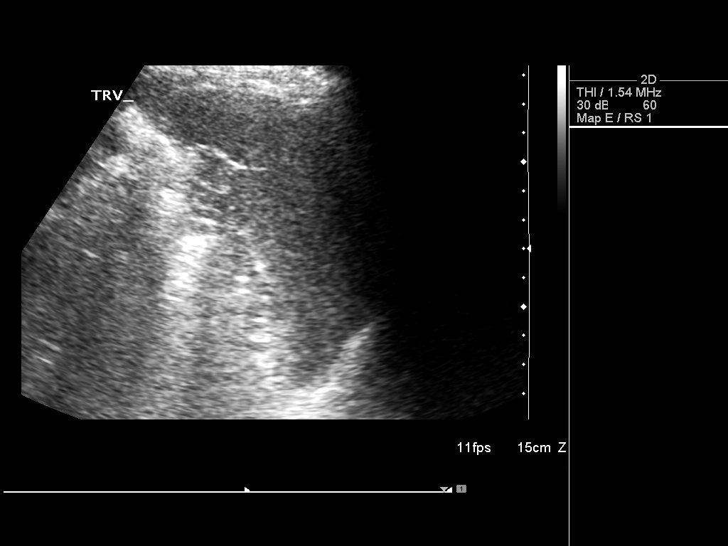

[14 of 25 positions shown; findings below may reference images not displayed]

FINDINGS: Gallbladder: Prior cholecystectomy

Common bile duct: Diameter: Normal caliber, 6-7 mm.

Liver: Diffusely increased echotexture throughout the liver
compatible with fatty infiltration. No focal abnormality.

IVC: No abnormality visualized.

Pancreas: Visualized portion unremarkable.

Spleen: Size and appearance within normal limits.

Right Kidney: Length: 12.3 cm. Echogenicity within normal limits. No
mass or hydronephrosis visualized.

Left Kidney: Length: 12.3 cm. Echogenicity within normal limits. No
mass or hydronephrosis visualized.

Abdominal aorta: No aneurysm visualized.

Other findings: None.
IMPRESSION: Fatty liver.  No acute findings.

Prior cholecystectomy.

## 2019-08-20 ENCOUNTER — Ambulatory Visit: Payer: Medicare Other | Attending: Internal Medicine

## 2019-08-20 DIAGNOSIS — Z23 Encounter for immunization: Secondary | ICD-10-CM | POA: Insufficient documentation

## 2019-08-20 NOTE — Progress Notes (Signed)
   Covid-19 Vaccination Clinic  Name:  Mitchell Pham    MRN: [id number] DOB: April 10, 1946  08/20/2019  [name] was observed post Covid-19 immunization for 15 minutes without incident. He was provided with Vaccine Information Sheet and instruction to access the V-Safe system.   [name] was instructed to call 911 with any severe reactions post vaccine: [name] Kitchen Difficulty breathing  . Swelling of face and throat  . A fast heartbeat  . A bad rash all over body  . Dizziness and weakness   Immunizations Administered    Name Date Dose VIS Date Route   Pfizer COVID-19 Vaccine 08/20/2019  9:52 AM 0.3 mL 05/26/2019 Intramuscular   Manufacturer: [employer], [employer]   Lot: [id number]   NDC: [address]-2

## 2019-09-13 ENCOUNTER — Ambulatory Visit: Payer: Medicare Other | Attending: Internal Medicine

## 2019-09-13 DIAGNOSIS — Z23 Encounter for immunization: Secondary | ICD-10-CM

## 2019-09-13 NOTE — Progress Notes (Signed)
   Covid-19 Vaccination Clinic  Name:  Mitchell Pham    MRN: [id number] DOB: 19-May-1946  09/13/2019  Mr. [name] was observed post Covid-19 immunization for 15 minutes without incident. He was provided with Vaccine Information Sheet and instruction to access the V-Safe system.   Mr. [name] was instructed to call 911 with any severe reactions post vaccine: [name] Kitchen Difficulty breathing  . Swelling of face and throat  . A fast heartbeat  . A bad rash all over body  . Dizziness and weakness   Immunizations Administered    Name Date Dose VIS Date Route   Pfizer COVID-19 Vaccine 09/13/2019  8:09 AM 0.3 mL 05/26/2019 Intramuscular   Manufacturer: [employer], [employer]   Lot: [id number]   NDC: [address]-2

## 2020-05-30 ENCOUNTER — Other Ambulatory Visit: Payer: Self-pay | Admitting: Family Medicine

## 2020-05-30 DIAGNOSIS — R1011 Right upper quadrant pain: Secondary | ICD-10-CM

## 2020-06-06 ENCOUNTER — Other Ambulatory Visit: Payer: Medicare Other

## 2020-06-17 ENCOUNTER — Ambulatory Visit
Admission: RE | Admit: 2020-06-17 | Discharge: 2020-06-17 | Disposition: A | Discharge status: Home / Self Care | Payer: Medicare Other | Source: Ambulatory Visit | Attending: Family Medicine | Admitting: Family Medicine

## 2020-06-17 ENCOUNTER — Other Ambulatory Visit: Payer: Self-pay

## 2020-06-17 DIAGNOSIS — R1011 Right upper quadrant pain: Secondary | ICD-10-CM | POA: Insufficient documentation

## 2020-06-17 DIAGNOSIS — K760 Fatty (change of) liver, not elsewhere classified: Secondary | ICD-10-CM | POA: Diagnosis not present

## 2020-06-25 ENCOUNTER — Other Ambulatory Visit: Payer: Self-pay | Admitting: Gastroenterology

## 2020-06-25 DIAGNOSIS — R1011 Right upper quadrant pain: Secondary | ICD-10-CM

## 2020-06-25 DIAGNOSIS — K760 Fatty (change of) liver, not elsewhere classified: Secondary | ICD-10-CM | POA: Diagnosis not present

## 2020-06-26 ENCOUNTER — Other Ambulatory Visit: Payer: Medicare Other

## 2020-07-04 DIAGNOSIS — E7800 Pure hypercholesterolemia, unspecified: Secondary | ICD-10-CM | POA: Diagnosis not present

## 2020-07-04 DIAGNOSIS — I10 Essential (primary) hypertension: Secondary | ICD-10-CM | POA: Diagnosis not present

## 2020-07-04 DIAGNOSIS — N183 Chronic kidney disease, stage 3 unspecified: Secondary | ICD-10-CM | POA: Diagnosis not present

## 2020-07-04 DIAGNOSIS — I129 Hypertensive chronic kidney disease with stage 1 through stage 4 chronic kidney disease, or unspecified chronic kidney disease: Secondary | ICD-10-CM | POA: Diagnosis not present

## 2020-07-04 DIAGNOSIS — E1122 Type 2 diabetes mellitus with diabetic chronic kidney disease: Secondary | ICD-10-CM | POA: Diagnosis not present

## 2020-07-08 DIAGNOSIS — E1122 Type 2 diabetes mellitus with diabetic chronic kidney disease: Secondary | ICD-10-CM | POA: Diagnosis not present

## 2020-07-10 ENCOUNTER — Ambulatory Visit
Admission: RE | Admit: 2020-07-10 | Discharge: 2020-07-10 | Disposition: A | Discharge status: Home / Self Care | Payer: Medicare Other | Source: Ambulatory Visit | Attending: Gastroenterology | Admitting: Gastroenterology

## 2020-07-10 ENCOUNTER — Other Ambulatory Visit: Payer: Self-pay

## 2020-07-10 DIAGNOSIS — K760 Fatty (change of) liver, not elsewhere classified: Secondary | ICD-10-CM | POA: Diagnosis not present

## 2020-07-10 DIAGNOSIS — R1011 Right upper quadrant pain: Secondary | ICD-10-CM

## 2020-07-10 MED ORDER — IOPAMIDOL (ISOVUE-300) INJECTION 61%
100.0000 mL | Freq: Once | INTRAVENOUS | Status: AC | PRN
  Administered 2020-07-10: 100 mL via INTRAVENOUS

## 2020-08-20 DIAGNOSIS — I10 Essential (primary) hypertension: Secondary | ICD-10-CM | POA: Diagnosis not present

## 2020-08-20 DIAGNOSIS — Z0000 Encounter for general adult medical examination without abnormal findings: Secondary | ICD-10-CM | POA: Diagnosis not present

## 2020-08-20 DIAGNOSIS — Z1389 Encounter for screening for other disorder: Secondary | ICD-10-CM | POA: Diagnosis not present

## 2020-08-20 DIAGNOSIS — E785 Hyperlipidemia, unspecified: Secondary | ICD-10-CM | POA: Diagnosis not present

## 2020-08-20 DIAGNOSIS — B0229 Other postherpetic nervous system involvement: Secondary | ICD-10-CM | POA: Diagnosis not present

## 2020-08-20 DIAGNOSIS — E119 Type 2 diabetes mellitus without complications: Secondary | ICD-10-CM | POA: Diagnosis not present

## 2020-08-20 DIAGNOSIS — Z0001 Encounter for general adult medical examination with abnormal findings: Secondary | ICD-10-CM | POA: Diagnosis not present

## 2020-08-20 DIAGNOSIS — K760 Fatty (change of) liver, not elsewhere classified: Secondary | ICD-10-CM | POA: Diagnosis not present

## 2020-12-17 ENCOUNTER — Other Ambulatory Visit: Payer: Self-pay

## 2020-12-17 ENCOUNTER — Encounter: Payer: Self-pay | Admitting: Ophthalmology

## 2020-12-31 NOTE — Discharge Instructions (Signed)

## 2021-01-01 ENCOUNTER — Encounter
Admission: RE | Disposition: A | Discharge status: Home / Self Care | Payer: Self-pay | Source: Home / Self Care | Attending: Ophthalmology

## 2021-01-01 ENCOUNTER — Ambulatory Visit: Payer: Medicare Other | Admitting: Anesthesiology

## 2021-01-01 ENCOUNTER — Other Ambulatory Visit: Payer: Self-pay

## 2021-01-01 ENCOUNTER — Ambulatory Visit
Admission: RE | Admit: 2021-01-01 | Discharge: 2021-01-01 | Disposition: A | Discharge status: Home / Self Care | Payer: Medicare Other | Attending: Ophthalmology | Admitting: Ophthalmology

## 2021-01-01 ENCOUNTER — Encounter: Payer: Self-pay | Admitting: Ophthalmology

## 2021-01-01 DIAGNOSIS — Z7982 Long term (current) use of aspirin: Secondary | ICD-10-CM | POA: Diagnosis not present

## 2021-01-01 DIAGNOSIS — I10 Essential (primary) hypertension: Secondary | ICD-10-CM | POA: Insufficient documentation

## 2021-01-01 DIAGNOSIS — Z7984 Long term (current) use of oral hypoglycemic drugs: Secondary | ICD-10-CM | POA: Insufficient documentation

## 2021-01-01 DIAGNOSIS — Z87891 Personal history of nicotine dependence: Secondary | ICD-10-CM | POA: Diagnosis not present

## 2021-01-01 DIAGNOSIS — H2512 Age-related nuclear cataract, left eye: Secondary | ICD-10-CM | POA: Diagnosis not present

## 2021-01-01 DIAGNOSIS — K7581 Nonalcoholic steatohepatitis (NASH): Secondary | ICD-10-CM | POA: Diagnosis not present

## 2021-01-01 DIAGNOSIS — E1136 Type 2 diabetes mellitus with diabetic cataract: Secondary | ICD-10-CM | POA: Diagnosis not present

## 2021-01-01 HISTORY — PX: CATARACT EXTRACTION W/PHACO: SHX586

## 2021-01-01 LAB — GLUCOSE, CAPILLARY
Glucose-Capillary: 146 mg/dL — ABNORMAL HIGH (ref 70–99)
Glucose-Capillary: 152 mg/dL — ABNORMAL HIGH (ref 70–99)

## 2021-01-01 SURGERY — PHACOEMULSIFICATION, CATARACT, WITH IOL INSERTION
Anesthesia: Monitor Anesthesia Care | Laterality: Left

## 2021-01-01 MED ORDER — FENTANYL CITRATE (PF) 100 MCG/2ML IJ SOLN
INTRAMUSCULAR | Status: DC | PRN
  Administered 2021-01-01: 50 ug via INTRAVENOUS

## 2021-01-01 MED ORDER — CEFUROXIME OPHTHALMIC INJECTION 1 MG/0.1 ML
INJECTION | OPHTHALMIC | Status: DC | PRN
  Administered 2021-01-01: 0.1 mL via INTRACAMERAL

## 2021-01-01 MED ORDER — PHENYLEPHRINE HCL 10 % OP SOLN
1.0000 [drp] | OPHTHALMIC | Status: AC
  Administered 2021-01-01 (×3): 1 [drp] via OPHTHALMIC

## 2021-01-01 MED ORDER — LACTATED RINGERS IV SOLN: INTRAVENOUS | Status: DC

## 2021-01-01 MED ORDER — TETRACAINE HCL 0.5 % OP SOLN
1.0000 [drp] | OPHTHALMIC | Status: DC | PRN
  Administered 2021-01-01 (×3): 1 [drp] via OPHTHALMIC

## 2021-01-01 MED ORDER — SIGHTPATH DOSE#1 BSS IO SOLN
INTRAOCULAR | Status: DC | PRN
  Administered 2021-01-01: 1 mL

## 2021-01-01 MED ORDER — BRIMONIDINE TARTRATE-TIMOLOL 0.2-0.5 % OP SOLN
OPHTHALMIC | Status: DC | PRN
  Administered 2021-01-01: 1 [drp] via OPHTHALMIC

## 2021-01-01 MED ORDER — CYCLOPENTOLATE HCL 2 % OP SOLN
1.0000 [drp] | OPHTHALMIC | Status: AC
  Administered 2021-01-01 (×3): 1 [drp] via OPHTHALMIC

## 2021-01-01 MED ORDER — MIDAZOLAM HCL 2 MG/2ML IJ SOLN
INTRAMUSCULAR | Status: DC | PRN
  Administered 2021-01-01: 1 mg via INTRAVENOUS

## 2021-01-01 MED ORDER — SIGHTPATH DOSE#1 BSS IO SOLN
INTRAOCULAR | Status: DC | PRN
  Administered 2021-01-01: 62 mL via OPHTHALMIC

## 2021-01-01 MED ORDER — SIGHTPATH DOSE#1 NA HYALUR & NA CHOND-NA HYALUR IO KIT
PACK | INTRAOCULAR | Status: DC | PRN
  Administered 2021-01-01: 1 via OPHTHALMIC

## 2021-01-01 SURGICAL SUPPLY — 18 items
CANNULA ANT/CHMB 27G (MISCELLANEOUS) ×1 IMPLANT
CANNULA ANT/CHMB 27GA (MISCELLANEOUS) ×2 IMPLANT
GLOVE SURG ENC TEXT LTX SZ7.5 (GLOVE) ×2 IMPLANT
GLOVE SURG TRIUMPH 8.0 PF LTX (GLOVE) ×2 IMPLANT
GOWN STRL REUS W/ TWL LRG LVL3 (GOWN DISPOSABLE) ×2 IMPLANT
GOWN STRL REUS W/TWL LRG LVL3 (GOWN DISPOSABLE) ×4
LENS IOL TECNIS EYHANCE 21.5 (Intraocular Lens) ×1 IMPLANT
MARKER SKIN DUAL TIP RULER LAB (MISCELLANEOUS) ×2 IMPLANT
NDL CAPSULORHEX 25GA (NEEDLE) ×1 IMPLANT
NDL FILTER BLUNT 18X1 1/2 (NEEDLE) ×2 IMPLANT
NEEDLE CAPSULORHEX 25GA (NEEDLE) ×2 IMPLANT
NEEDLE FILTER BLUNT 18X 1/2SAF (NEEDLE) ×2
NEEDLE FILTER BLUNT 18X1 1/2 (NEEDLE) ×2 IMPLANT
PACK EYE AFTER SURG (MISCELLANEOUS) ×2 IMPLANT
SYR 3ML LL SCALE MARK (SYRINGE) ×4 IMPLANT
SYR TB 1ML LUER SLIP (SYRINGE) ×2 IMPLANT
WATER STERILE IRR 250ML POUR (IV SOLUTION) ×2 IMPLANT
WIPE NON LINTING 3.25X3.25 (MISCELLANEOUS) ×2 IMPLANT

## 2021-01-01 NOTE — Anesthesia Postprocedure Evaluation (Signed)
Anesthesia Post Note  Patient: Mitchell Pham  Procedure(s) Performed: CATARACT EXTRACTION PHACO AND INTRAOCULAR LENS PLACEMENT (IOC) LEFT DIABETIC 4.23 00:56.8 (Left)     Patient location during evaluation: PACU Anesthesia Type: MAC Level of consciousness: awake and alert Pain management: pain level controlled Vital Signs Assessment: post-procedure vital signs reviewed and stable Respiratory status: spontaneous breathing, nonlabored ventilation, respiratory function stable and patient connected to nasal cannula oxygen Cardiovascular status: stable and blood pressure returned to baseline Postop Assessment: no apparent nausea or vomiting Anesthetic complications: no   No notable events documented.  Adele Barthel Tazaria Dlugosz

## 2021-01-01 NOTE — H&P (Signed)
[facility]   Primary Care Physician:  Gracelyn Nurse, MD Ophthalmologist: Dr. Lockie Mola  Pre-Procedure History & Physical: HPI:  Mitchell Pham is a 75 y.o. male here for ophthalmic surgery.   Past Medical History:  Diagnosis Date   GERD (gastroesophageal reflux disease)    Heart murmur    "told I have one once"   Hypercholesterolemia    Hypertension    Type II diabetes mellitus (HCC)     Past Surgical History:  Procedure Laterality Date   ERCP N/A 12/19/2014   Procedure: ENDOSCOPIC RETROGRADE CHOLANGIOPANCREATOGRAPHY (ERCP);  Surgeon: Dorena Cookey, MD;  Location: [facility] ENDOSCOPY;  Service: Endoscopy;  Laterality: N/A;   KNEE ARTHROSCOPY Left ~ 1988   LAPAROSCOPIC ABDOMINAL EXPLORATION  02/2014   "tried to take my gallbladder out; it was too inflamed"   LAPAROSCOPIC CHOLECYSTECTOMY  05/2014   TONSILLECTOMY AND ADENOIDECTOMY  ~ 1955    Prior to Admission medications   Medication Sig Start Date End Date Taking? Authorizing Provider  allopurinol (ZYLOPRIM) 300 MG tablet Take 300 mg by mouth 2 (two) times daily.   Yes [provider]  amLODipine (NORVASC) 10 MG tablet Take 10 mg by mouth every evening.   Yes [provider]  atorvastatin (LIPITOR) 10 MG tablet Take 10 mg by mouth daily.   Yes [provider]  Cholecalciferol (VITAMIN D3) 1.25 MG (50000 UT) CAPS Take by mouth.   Yes [provider]  Cobalamin Combinations (NEURIVA PLUS PO) Take by mouth.   Yes [provider]  lisinopril (PRINIVIL,ZESTRIL) 20 MG tablet Take 20 mg by mouth 2 (two) times daily.   Yes [provider]  metFORMIN (GLUCOPHAGE) 500 MG tablet Take 1,000 mg by mouth 2 (two) times daily with a meal.    Yes [provider]  Multiple Vitamin (MULTIVITAMIN WITH MINERALS) TABS tablet Take 1 tablet by mouth daily.   Yes [provider]  Multiple Vitamins-Minerals (PRESERVISION AREDS PO) Take by mouth.   Yes [provider]  omeprazole (PRILOSEC) 20 MG capsule Take 20 mg by mouth See admin instructions. Take 1 tablet by mouth only on Tuesday, Thursdays and saturdays   Yes [provider]  aspirin 81 MG tablet Take 81 mg by mouth daily. Patient not taking: Reported on 12/17/2020    [provider]  cyclobenzaprine (FLEXERIL) 10 MG tablet Take 1 tablet (10 mg total) by mouth 3 (three) times daily as needed for muscle spasms. Patient not taking: Reported on 12/17/2020 05/09/16   Willy Eddy, MD  Misc Natural Products (OSTEO BI-FLEX JOINT SHIELD PO) Take 1 tablet by mouth 2 (two) times daily. Patient not taking: Reported on 12/17/2020    [provider]  potassium chloride SA (K-DUR,KLOR-CON) 20 MEQ tablet Take 20 mEq by mouth daily. Patient not taking: Reported on 12/17/2020    [provider]    Allergies as of 11/19/2020   (No Known Allergies)    History reviewed. No pertinent family history.  Social History   Socioeconomic History   Marital status: Married    Spouse name: Not on file   Number of children: Not on file   Years of education: Not on file   Highest education level: Not on file  Occupational History   Not on file  Tobacco Use   Smoking status: Former    Packs/day: 2.00    Years: 20.00    Pack years: 40.00    Types: Cigarettes    Quit date: 03/15/1986  Years since quitting: 34.8   Smokeless tobacco: Never  Substance and Sexual Activity   Alcohol use: Yes    Alcohol/week: 6.0 standard drinks    Types: 6 Glasses of wine per week   Drug use: No   Sexual activity: Not Currently  Other Topics Concern   Not on file  Social History Narrative   Not on file   Social Determinants of Health   Financial Resource Strain: Not on file  Food Insecurity: Not on file  Transportation Needs: Not on file  Physical Activity: Not on file  Stress: Not on file  Social Connections: Not on file  Intimate Partner Violence: Not on file    Review  of Systems: See HPI, otherwise negative ROS  Physical Exam: BP (!) 144/75   Pulse 87   Temp 98.1 F (36.7 C) (Temporal)   Resp 16   Ht 5\' 11"  (1.803 m)   Wt 99.8 kg   SpO2 97%   BMI 30.68 kg/m  General:   Alert,  pleasant and cooperative in NAD Head:  Normocephalic and atraumatic. Lungs:  Clear to auscultation.    Heart:  Regular rate and rhythm.   Impression/Plan: is here for ophthalmic surgery.  Risks, benefits, limitations, and alternatives regarding ophthalmic surgery have been reviewed with the patient.  Questions have been answered.  All parties agreeable.   [name], MD  01/01/2021, 10:44 AM

## 2021-01-01 NOTE — Op Note (Signed)
  OPERATIVE NOTE  MAC DOWDELL [id number] 01/01/2021   PREOPERATIVE DIAGNOSIS:  Nuclear sclerotic cataract left eye. H25.12   POSTOPERATIVE DIAGNOSIS:    Nuclear sclerotic cataract left eye.     PROCEDURE:  Phacoemusification with posterior chamber intraocular lens placement of the left eye  Ultrasound time: Procedure(s): CATARACT EXTRACTION PHACO AND INTRAOCULAR LENS PLACEMENT (IOC) LEFT DIABETIC 4.23 00:56.8 (Left)  LENS:   Implant Name Type Inv. Item Serial No. Manufacturer Lot No. LRB No. Used Action  LENS IOL TECNIS EYHANCE 21.5 - [id number] Intraocular Lens LENS IOL TECNIS EYHANCE 21.5 [id number] JOHNSON   Left 1 Implanted      SURGEON:  Deirdre Evener, MD   ANESTHESIA:  Topical with tetracaine drops and 2% Xylocaine jelly, augmented with 1% preservative-free intracameral lidocaine.    COMPLICATIONS:  None.   DESCRIPTION OF PROCEDURE:  The patient was identified in the holding room and transported to the operating room and placed in the supine position under the operating microscope.  The left eye was identified as the operative eye and it was prepped and draped in the usual sterile ophthalmic fashion.   A 1 millimeter clear-corneal paracentesis was made at the 1:30 position.  0.5 ml of preservative-free 1% lidocaine was injected into the anterior chamber.  The anterior chamber was filled with Viscoat viscoelastic.  A 2.4 millimeter keratome was used to make a near-clear corneal incision at the 10:30 position.  .  A curvilinear capsulorrhexis was made with a cystotome and capsulorrhexis forceps.  Balanced salt solution was used to hydrodissect and hydrodelineate the nucleus.   Phacoemulsification was then used in stop and chop fashion to remove the lens nucleus and epinucleus.  The remaining cortex was then removed using the irrigation and aspiration handpiece. Provisc was then placed into the capsular bag to distend it for lens placement.  A lens was then injected  into the capsular bag.  The remaining viscoelastic was aspirated.   Wounds were hydrated with balanced salt solution.  The anterior chamber was inflated to a physiologic pressure with balanced salt solution.  No wound leaks were noted. Cefuroxime 0.1 ml of a 10mg /ml solution was injected into the anterior chamber for a dose of 1 mg of intracameral antibiotic at the completion of the case.   Timolol and Brimonidine drops were applied to the eye.  The patient was taken to the recovery room in stable condition without complications of anesthesia or surgery.  Nansi Birmingham 01/01/2021, 12:07 PM

## 2021-01-01 NOTE — Anesthesia Procedure Notes (Signed)
Procedure Name: MAC Date/Time: 01/01/2021 11:50 AM Performed by: Cameron Ali, CRNA Pre-anesthesia Checklist: Patient identified, Emergency Drugs available, Suction available, Timeout performed and Patient being monitored Patient Re-evaluated:Patient Re-evaluated prior to induction Oxygen Delivery Method: Nasal cannula Placement Confirmation: positive ETCO2

## 2021-01-01 NOTE — Transfer of Care (Signed)
Immediate Anesthesia Transfer of Care Note  Patient: Mitchell Pham  Procedure(s) Performed: CATARACT EXTRACTION PHACO AND INTRAOCULAR LENS PLACEMENT (IOC) LEFT DIABETIC 4.23 00:56.8 (Left)  Patient Location: PACU  Anesthesia Type: MAC  Level of Consciousness: awake, alert  and patient cooperative  Airway and Oxygen Therapy: Patient Spontanous Breathing and Patient connected to supplemental oxygen  Post-op Assessment: Post-op Vital signs reviewed, Patient's Cardiovascular Status Stable, Respiratory Function Stable, Patent Airway and No signs of Nausea or vomiting  Post-op Vital Signs: Reviewed and stable  Complications: No notable events documented.

## 2021-01-01 NOTE — Anesthesia Preprocedure Evaluation (Signed)
Anesthesia Evaluation  Patient identified by MRN, date of birth, ID band Patient awake    History of Anesthesia Complications Negative for: history of anesthetic complications  Airway Mallampati: II  TM Distance: >3 FB Neck ROM: Full    Dental no notable dental hx.    Pulmonary former smoker,    Pulmonary exam normal        Cardiovascular Exercise Tolerance: Good hypertension, Normal cardiovascular exam     Neuro/Psych post-herpetic neuralgia    GI/Hepatic NASH   Endo/Other  diabetes, Well Controlled, Type 2  Renal/GU      Musculoskeletal   Abdominal   Peds  Hematology negative hematology ROS (+)   Anesthesia Other Findings   Reproductive/Obstetrics                            Anesthesia Physical Anesthesia Plan  ASA: 2  Anesthesia Plan: MAC   Post-op Pain Management:    Induction: Intravenous  PONV Risk Score and Plan: 1 and TIVA, Midazolam and Treatment may vary due to age or medical condition  Airway Management Planned: Nasal Cannula and Natural Airway  Additional Equipment: None  Intra-op Plan:   Post-operative Plan:   Informed Consent: I have reviewed the patients History and Physical, chart, labs and discussed the procedure including the risks, benefits and alternatives for the proposed anesthesia with the patient or authorized representative who has indicated his/her understanding and acceptance.       Plan Discussed with: CRNA  Anesthesia Plan Comments:         Anesthesia Quick Evaluation

## 2021-01-02 ENCOUNTER — Encounter: Payer: Self-pay | Admitting: Ophthalmology

## 2021-01-02 MED ORDER — SIGHTPATH DOSE#1 BSS IO SOLN
INTRAOCULAR | Status: DC | PRN
Start: 2021-01-01 — End: 2021-01-02
  Administered 2021-01-01: 15 mL via INTRAOCULAR

## 2021-01-14 NOTE — Discharge Instructions (Signed)

## 2021-01-15 ENCOUNTER — Ambulatory Visit
Admission: RE | Admit: 2021-01-15 | Discharge: 2021-01-15 | Disposition: A | Discharge status: Home / Self Care | Payer: Medicare Other | Attending: Ophthalmology | Admitting: Ophthalmology

## 2021-01-15 ENCOUNTER — Other Ambulatory Visit: Payer: Self-pay

## 2021-01-15 ENCOUNTER — Encounter: Payer: Self-pay | Admitting: Ophthalmology

## 2021-01-15 ENCOUNTER — Ambulatory Visit: Payer: Medicare Other | Admitting: Anesthesiology

## 2021-01-15 ENCOUNTER — Encounter
Admission: RE | Disposition: A | Discharge status: Home / Self Care | Payer: Self-pay | Source: Home / Self Care | Attending: Ophthalmology

## 2021-01-15 DIAGNOSIS — Z87891 Personal history of nicotine dependence: Secondary | ICD-10-CM | POA: Diagnosis not present

## 2021-01-15 DIAGNOSIS — Z7982 Long term (current) use of aspirin: Secondary | ICD-10-CM | POA: Insufficient documentation

## 2021-01-15 DIAGNOSIS — H2511 Age-related nuclear cataract, right eye: Secondary | ICD-10-CM | POA: Diagnosis not present

## 2021-01-15 DIAGNOSIS — E1136 Type 2 diabetes mellitus with diabetic cataract: Secondary | ICD-10-CM | POA: Insufficient documentation

## 2021-01-15 DIAGNOSIS — Z7984 Long term (current) use of oral hypoglycemic drugs: Secondary | ICD-10-CM | POA: Diagnosis not present

## 2021-01-15 DIAGNOSIS — Z79899 Other long term (current) drug therapy: Secondary | ICD-10-CM | POA: Diagnosis not present

## 2021-01-15 HISTORY — PX: CATARACT EXTRACTION W/PHACO: SHX586

## 2021-01-15 LAB — GLUCOSE, CAPILLARY
Glucose-Capillary: 157 mg/dL — ABNORMAL HIGH (ref 70–99)
Glucose-Capillary: 159 mg/dL — ABNORMAL HIGH (ref 70–99)

## 2021-01-15 SURGERY — PHACOEMULSIFICATION, CATARACT, WITH IOL INSERTION
Anesthesia: General | Site: Eye | Laterality: Right

## 2021-01-15 MED ORDER — SIGHTPATH DOSE#1 BSS IO SOLN
INTRAOCULAR | Status: DC | PRN
  Administered 2021-01-15: 15 mL

## 2021-01-15 MED ORDER — FENTANYL CITRATE (PF) 100 MCG/2ML IJ SOLN
INTRAMUSCULAR | Status: DC | PRN
  Administered 2021-01-15: 50 ug via INTRAVENOUS

## 2021-01-15 MED ORDER — MIDAZOLAM HCL 2 MG/2ML IJ SOLN
INTRAMUSCULAR | Status: DC | PRN
  Administered 2021-01-15: 2 mg via INTRAVENOUS

## 2021-01-15 MED ORDER — CEFUROXIME OPHTHALMIC INJECTION 1 MG/0.1 ML
INJECTION | OPHTHALMIC | Status: DC | PRN
  Administered 2021-01-15: 0.1 mL via OPHTHALMIC

## 2021-01-15 MED ORDER — SIGHTPATH DOSE#1 BSS IO SOLN
INTRAOCULAR | Status: DC | PRN
  Administered 2021-01-15: 1 mL via INTRAMUSCULAR

## 2021-01-15 MED ORDER — PHENYLEPHRINE HCL 10 % OP SOLN
1.0000 [drp] | OPHTHALMIC | Status: AC
  Administered 2021-01-15 (×3): 1 [drp] via OPHTHALMIC

## 2021-01-15 MED ORDER — BRIMONIDINE TARTRATE-TIMOLOL 0.2-0.5 % OP SOLN
OPHTHALMIC | Status: DC | PRN
  Administered 2021-01-15: 1 [drp] via OPHTHALMIC

## 2021-01-15 MED ORDER — SIGHTPATH DOSE#1 NA HYALUR & NA CHOND-NA HYALUR IO KIT
PACK | INTRAOCULAR | Status: DC | PRN
  Administered 2021-01-15: 1 via OPHTHALMIC

## 2021-01-15 MED ORDER — LACTATED RINGERS IV SOLN: INTRAVENOUS | Status: DC

## 2021-01-15 MED ORDER — SIGHTPATH DOSE#1 BSS IO SOLN
INTRAOCULAR | Status: DC | PRN
  Administered 2021-01-15: 55 mL via OPHTHALMIC

## 2021-01-15 MED ORDER — CYCLOPENTOLATE HCL 2 % OP SOLN
1.0000 [drp] | OPHTHALMIC | Status: AC
  Administered 2021-01-15 (×3): 1 [drp] via OPHTHALMIC

## 2021-01-15 MED ORDER — TETRACAINE HCL 0.5 % OP SOLN
1.0000 [drp] | OPHTHALMIC | Status: DC | PRN
  Administered 2021-01-15 (×3): 1 [drp] via OPHTHALMIC

## 2021-01-15 SURGICAL SUPPLY — 19 items
CANNULA ANT/CHMB 27G (MISCELLANEOUS) ×1 IMPLANT
CANNULA ANT/CHMB 27GA (MISCELLANEOUS) ×2 IMPLANT
GLOVE SURG ENC TEXT LTX SZ7.5 (GLOVE) ×2 IMPLANT
GLOVE SURG TRIUMPH 8.0 PF LTX (GLOVE) ×2 IMPLANT
GOWN STRL REUS W/ TWL LRG LVL3 (GOWN DISPOSABLE) ×2 IMPLANT
GOWN STRL REUS W/TWL LRG LVL3 (GOWN DISPOSABLE) ×4
LENS IOL DIOP 21.5 (Intraocular Lens) ×2 IMPLANT
LENS IOL TECNIS MONO 21.5 (Intraocular Lens) IMPLANT
MARKER SKIN DUAL TIP RULER LAB (MISCELLANEOUS) ×2 IMPLANT
NDL CAPSULORHEX 25GA (NEEDLE) ×1 IMPLANT
NDL FILTER BLUNT 18X1 1/2 (NEEDLE) ×2 IMPLANT
NEEDLE CAPSULORHEX 25GA (NEEDLE) ×2 IMPLANT
NEEDLE FILTER BLUNT 18X 1/2SAF (NEEDLE) ×2
NEEDLE FILTER BLUNT 18X1 1/2 (NEEDLE) ×2 IMPLANT
PACK EYE AFTER SURG (MISCELLANEOUS) ×2 IMPLANT
SYR 3ML LL SCALE MARK (SYRINGE) ×4 IMPLANT
SYR TB 1ML LUER SLIP (SYRINGE) ×2 IMPLANT
WATER STERILE IRR 250ML POUR (IV SOLUTION) ×2 IMPLANT
WIPE NON LINTING 3.25X3.25 (MISCELLANEOUS) ×2 IMPLANT

## 2021-01-15 NOTE — Anesthesia Postprocedure Evaluation (Addendum)
Anesthesia Post Note  Patient: [name]  Procedure(s) Performed: CATARACT EXTRACTION PHACO AND INTRAOCULAR LENS PLACEMENT (IOC) RIGHT DIABETIC (Right: Eye)     Patient location during evaluation: PACU Anesthesia Type: General Level of consciousness: awake and alert Pain management: pain level controlled Vital Signs Assessment: post-procedure vital signs reviewed and stable Respiratory status: spontaneous breathing, nonlabored ventilation, respiratory function stable and patient connected to nasal cannula oxygen Cardiovascular status: stable and blood pressure returned to baseline Postop Assessment: no apparent nausea or vomiting Anesthetic complications: no   No notable events documented.  Lorre Opdahl, Salvadore Dom

## 2021-01-15 NOTE — Anesthesia Preprocedure Evaluation (Addendum)
Anesthesia Evaluation  Patient identified by MRN, date of birth, ID band Patient awake    Reviewed: Allergy & Precautions, H&P , NPO status , Patient's Chart, lab work & pertinent test results, reviewed documented beta blocker date and time   Airway Mallampati: II  TM Distance: >3 FB Neck ROM: full    Dental no notable dental hx.    Pulmonary neg pulmonary ROS, former smoker,    Pulmonary exam normal breath sounds clear to auscultation       Cardiovascular Exercise Tolerance: Good hypertension, negative cardio ROS   Rhythm:regular Rate:Normal     Neuro/Psych negative neurological ROS  negative psych ROS   GI/Hepatic negative GI ROS, Neg liver ROS, GERD  ,  Endo/Other  negative endocrine ROSdiabetes  Renal/GU negative Renal ROS  negative genitourinary   Musculoskeletal   Abdominal   Peds  Hematology negative hematology ROS (+)   Anesthesia Other Findings   Reproductive/Obstetrics negative OB ROS                             Anesthesia Physical Anesthesia Plan  ASA: 2  Anesthesia Plan: MAC   Post-op Pain Management:    Induction:   PONV Risk Score and Plan:   Airway Management Planned:   Additional Equipment:   Intra-op Plan:   Post-operative Plan:   Informed Consent: I have reviewed the patients History and Physical, chart, labs and discussed the procedure including the risks, benefits and alternatives for the proposed anesthesia with the patient or authorized representative who has indicated his/her understanding and acceptance.     Dental Advisory Given  Plan Discussed with: CRNA  Anesthesia Plan Comments:        Anesthesia Quick Evaluation

## 2021-01-15 NOTE — Op Note (Signed)
LOCATION:  [facility]   PREOPERATIVE DIAGNOSIS:    Nuclear sclerotic cataract right eye. H25.11   POSTOPERATIVE DIAGNOSIS:  Nuclear sclerotic cataract right eye.     PROCEDURE:  Phacoemusification with posterior chamber intraocular lens placement of the right eye   ULTRASOUND TIME: Procedure(s) with comments: CATARACT EXTRACTION PHACO AND INTRAOCULAR LENS PLACEMENT (IOC) RIGHT DIABETIC (Right) - 4.80 00:45.3  LENS:   Implant Name Type Inv. Item Serial No. Manufacturer Lot No. LRB No. Used Action  LENS IOL DIOP 21.5 - [id number] Intraocular Lens LENS IOL DIOP 21.5 [id number]   Right 1 Implanted         SURGEON:  Deirdre Evener, MD   ANESTHESIA:  Topical with tetracaine drops and 2% Xylocaine jelly, augmented with 1% preservative-free intracameral lidocaine.    COMPLICATIONS:  None.   DESCRIPTION OF PROCEDURE:  The patient was identified in the holding room and transported to the operating room and placed in the supine position under the operating microscope.  The right eye was identified as the operative eye and it was prepped and draped in the usual sterile ophthalmic fashion.   A 1 millimeter clear-corneal paracentesis was made at the 12:00 position.  0.5 ml of preservative-free 1% lidocaine was injected into the anterior chamber. The anterior chamber was filled with Viscoat viscoelastic.  A 2.4 millimeter keratome was used to make a near-clear corneal incision at the 9:00 position.  A curvilinear capsulorrhexis was made with a cystotome and capsulorrhexis forceps.  Balanced salt solution was used to hydrodissect and hydrodelineate the nucleus.   Phacoemulsification was then used in stop and chop fashion to remove the lens nucleus and epinucleus.  The remaining cortex was then removed using the irrigation and aspiration handpiece. Provisc was then placed into the capsular bag to distend it for lens placement.  A lens was then injected into the capsular bag.   The remaining viscoelastic was aspirated.   Wounds were hydrated with balanced salt solution.  The anterior chamber was inflated to a physiologic pressure with balanced salt solution.  No wound leaks were noted. Cefuroxime 0.1 ml of a 10mg /ml solution was injected into the anterior chamber for a dose of 1 mg of intracameral antibiotic at the completion of the case.   Timolol and Brimonidine drops were applied to the eye.  The patient was taken to the recovery room in stable condition without complications of anesthesia or surgery.   Mitchell Pham 01/15/2021, 8:20 AM

## 2021-01-15 NOTE — H&P (Signed)
[facility]   Primary Care Physician:  Gracelyn Nurse, MD Ophthalmologist: Dr. Lockie Mola  Pre-Procedure History & Physical: HPI:  Mitchell Pham is a 75 y.o. male here for ophthalmic surgery.   Past Medical History:  Diagnosis Date   GERD (gastroesophageal reflux disease)    Heart murmur    "told I have one once"   Hypercholesterolemia    Hypertension    Type II diabetes mellitus (HCC)     Past Surgical History:  Procedure Laterality Date   CATARACT EXTRACTION W/PHACO Left 01/01/2021   Procedure: CATARACT EXTRACTION PHACO AND INTRAOCULAR LENS PLACEMENT (IOC) LEFT DIABETIC 4.23 00:56.8;  Surgeon: Lockie Mola, MD;  Location: [facility];  Service: Ophthalmology;  Laterality: Left;   ERCP N/A 12/19/2014   Procedure: ENDOSCOPIC RETROGRADE CHOLANGIOPANCREATOGRAPHY (ERCP);  Surgeon: Dorena Cookey, MD;  Location: [facility] END[facility];  Service: Endoscopy;  Laterality: N/A;   KNEE ARTHROSCOPY Left ~ 1988   LAPAROSCOPIC ABDOMINAL EXPLORATION  02/2014   "tried to take my gallbladder out; it was too inflamed"   LAPAROSCOPIC CHOLECYSTECTOMY  05/2014   TONSILLECTOMY AND ADENOIDECTOMY  ~ 1955    Prior to Admission medications   Medication Sig Start Date End Date Taking? Authorizing Provider  allopurinol (ZYLOPRIM) 300 MG tablet Take 300 mg by mouth 2 (two) times daily.   Yes [provider]  amLODipine (NORVASC) 10 MG tablet Take 10 mg by mouth every evening.   Yes [provider]  atorvastatin (LIPITOR) 10 MG tablet Take 10 mg by mouth daily.   Yes [provider]  Cholecalciferol (VITAMIN D3) 1.25 MG (50000 UT) CAPS Take by mouth.   Yes [provider]  Cobalamin Combinations (NEURIVA PLUS PO) Take by mouth.   Yes [provider]  lisinopril (PRINIVIL,ZESTRIL) 20 MG tablet Take 20 mg by mouth 2 (two) times daily.   Yes [provider]  metFORMIN (GLUCOPHAGE) 500 MG tablet Take 1,000 mg by mouth 2 (two)  times daily with a meal.    Yes [provider]  Multiple Vitamin (MULTIVITAMIN WITH MINERALS) TABS tablet Take 1 tablet by mouth daily.   Yes [provider]  Multiple Vitamins-Minerals (PRESERVISION AREDS PO) Take by mouth.   Yes [provider]  omeprazole (PRILOSEC) 20 MG capsule Take 20 mg by mouth See admin instructions. Take 1 tablet by mouth only on Tuesday, Thursdays and saturdays   Yes [provider]  aspirin 81 MG tablet Take 81 mg by mouth daily. Patient not taking: Reported on 12/17/2020    [provider]  cyclobenzaprine (FLEXERIL) 10 MG tablet Take 1 tablet (10 mg total) by mouth 3 (three) times daily as needed for muscle spasms. Patient not taking: Reported on 12/17/2020 05/09/16   Willy Eddy, MD  Misc Natural Products (OSTEO BI-FLEX JOINT SHIELD PO) Take 1 tablet by mouth 2 (two) times daily. Patient not taking: Reported on 12/17/2020    [provider]  potassium chloride SA (K-DUR,KLOR-CON) 20 MEQ tablet Take 20 mEq by mouth daily. Patient not taking: Reported on 12/17/2020    [provider]    Allergies as of 11/19/2020   (No Known Allergies)    History reviewed. No pertinent family history.  Social History   Socioeconomic History   Marital status: Married    Spouse name: Not on file   Number of children: Not on file   Years of education: Not on file   Highest education level: Not on file  Occupational History  Not on file  Tobacco Use   Smoking status: Former    Packs/day: 2.00    Years: 20.00    Pack years: 40.00    Types: Cigarettes    Quit date: 03/15/1986    Years since quitting: 34.8   Smokeless tobacco: Never  Substance and Sexual Activity   Alcohol use: Yes    Alcohol/week: 6.0 standard drinks    Types: 6 Glasses of wine per week   Drug use: No   Sexual activity: Not Currently  Other Topics Concern   Not on file  Social History Narrative   Not on file   Social Determinants of  Health   Financial Resource Strain: Not on file  Food Insecurity: Not on file  Transportation Needs: Not on file  Physical Activity: Not on file  Stress: Not on file  Social Connections: Not on file  Intimate Partner Violence: Not on file    Review of Systems: See HPI, otherwise negative ROS  Physical Exam: BP (!) 160/80   Pulse 73   Temp (!) 97.3 F (36.3 C) (Temporal)   Ht 5\' 11"  (1.803 m)   Wt 101.2 kg   SpO2 96%   BMI 31.10 kg/m  General:   Alert,  pleasant and cooperative in NAD Head:  Normocephalic and atraumatic. Lungs:  Clear to auscultation.    Heart:  Regular rate and rhythm.   Impression/Plan: is here for ophthalmic surgery.  Risks, benefits, limitations, and alternatives regarding ophthalmic surgery have been reviewed with the patient.  Questions have been answered.  All parties agreeable.   [name], MD  01/15/2021, 7:33 AM

## 2021-01-15 NOTE — Transfer of Care (Signed)
Immediate Anesthesia Transfer of Care Note  Patient: Mitchell Pham  Procedure(s) Performed: CATARACT EXTRACTION PHACO AND INTRAOCULAR LENS PLACEMENT (IOC) RIGHT DIABETIC (Right: Eye)  Patient Location: PACU  Anesthesia Type: General  Level of Consciousness: awake, alert  and patient cooperative  Airway and Oxygen Therapy: Patient Spontanous Breathing and Patient connected to supplemental oxygen  Post-op Assessment: Post-op Vital signs reviewed, Patient's Cardiovascular Status Stable, Respiratory Function Stable, Patent Airway and No signs of Nausea or vomiting  Post-op Vital Signs: Reviewed and stable  Complications: No notable events documented.

## 2022-04-07 ENCOUNTER — Other Ambulatory Visit: Payer: Self-pay | Admitting: Orthopaedic Surgery

## 2022-04-07 ENCOUNTER — Other Ambulatory Visit (HOSPITAL_COMMUNITY): Payer: Self-pay | Admitting: Orthopaedic Surgery

## 2022-04-07 DIAGNOSIS — M75122 Complete rotator cuff tear or rupture of left shoulder, not specified as traumatic: Secondary | ICD-10-CM

## 2022-04-16 ENCOUNTER — Ambulatory Visit
Admission: RE | Admit: 2022-04-16 | Discharge: 2022-04-16 | Disposition: A | Discharge status: Home / Self Care | Payer: Medicare Other | Source: Ambulatory Visit | Attending: Orthopaedic Surgery | Admitting: Orthopaedic Surgery

## 2022-04-16 DIAGNOSIS — M75122 Complete rotator cuff tear or rupture of left shoulder, not specified as traumatic: Secondary | ICD-10-CM | POA: Diagnosis present

## 2022-05-20 IMAGING — US US ABDOMEN COMPLETE
1 series · 14 of 25 positions shown · non-contrast
Comparison: 05/27/2007 CT with contrast

CLINICAL DATA: Right upper quadrant pain for 2 months

EXAM:
ABDOMEN ULTRASOUND COMPLETE

[Series 1: us abdomen complete · 14 of 75 slices shown]
[im 1/75]
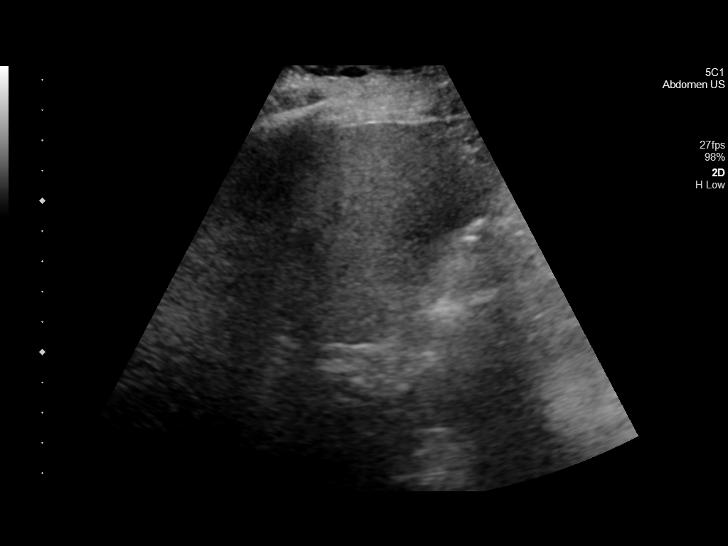
[im 7/75]
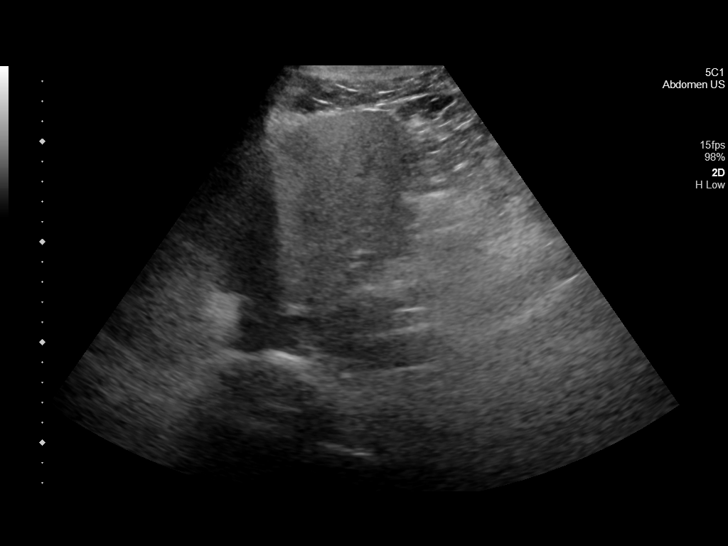
[im 13/75]
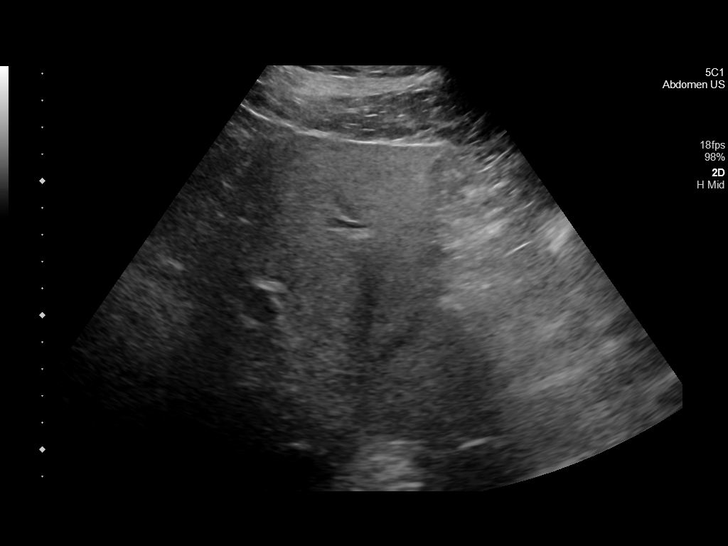
[im 19/75]
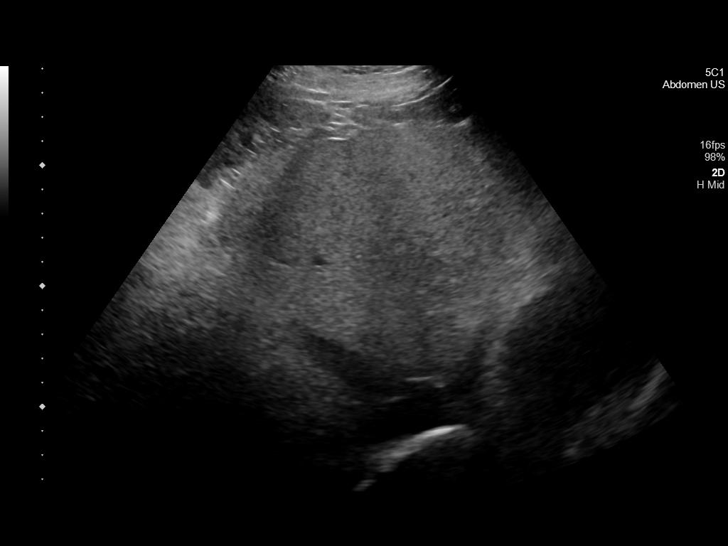
[im 25/75]
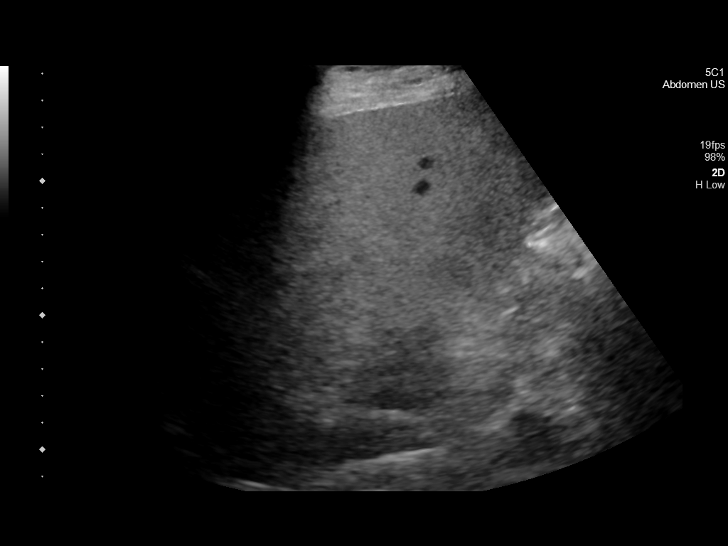
[im 28/75]
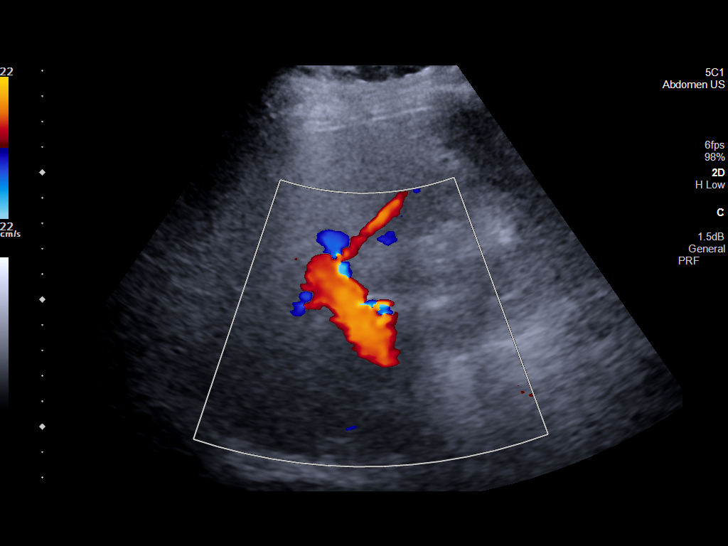
[im 34/75]
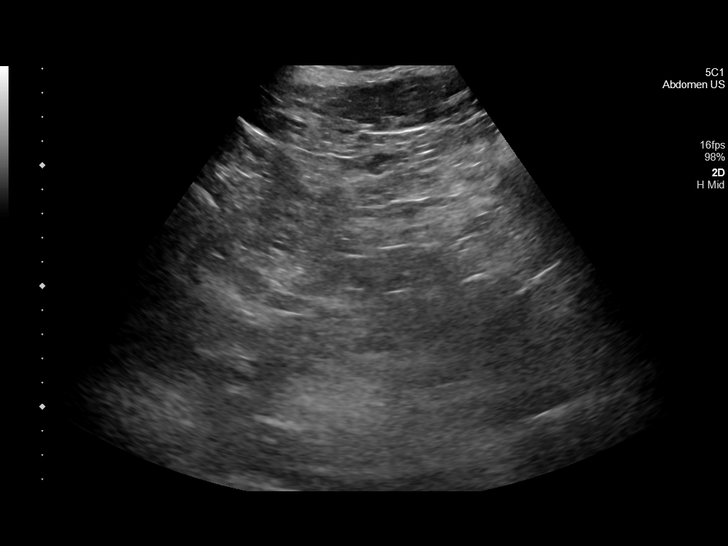
[im 41/75]
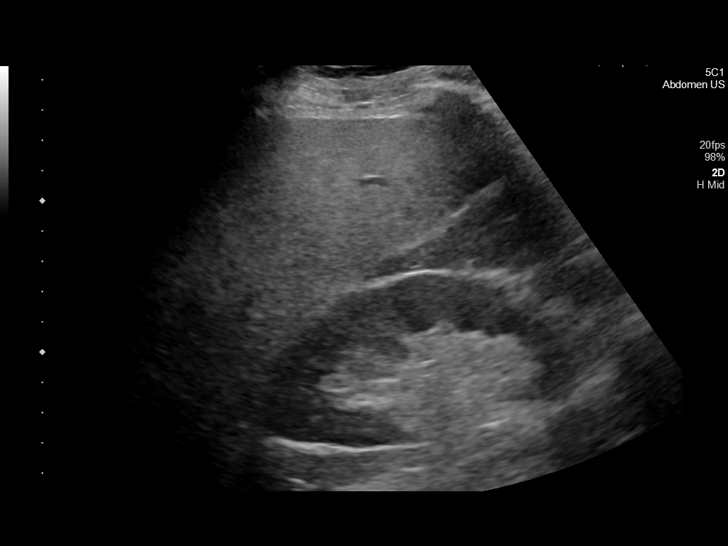
[im 47/75]
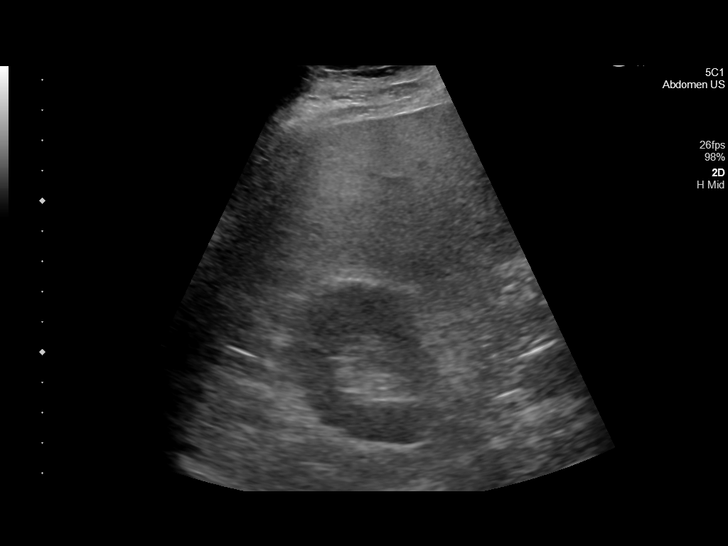
[im 50/75]
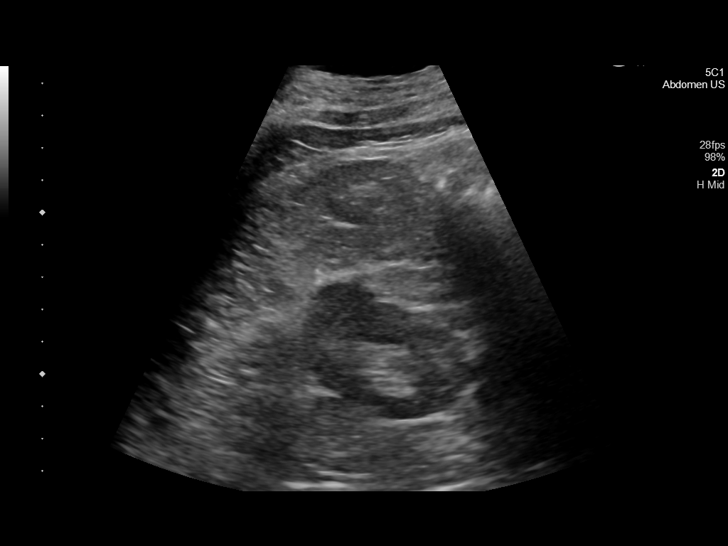
[im 56/75]
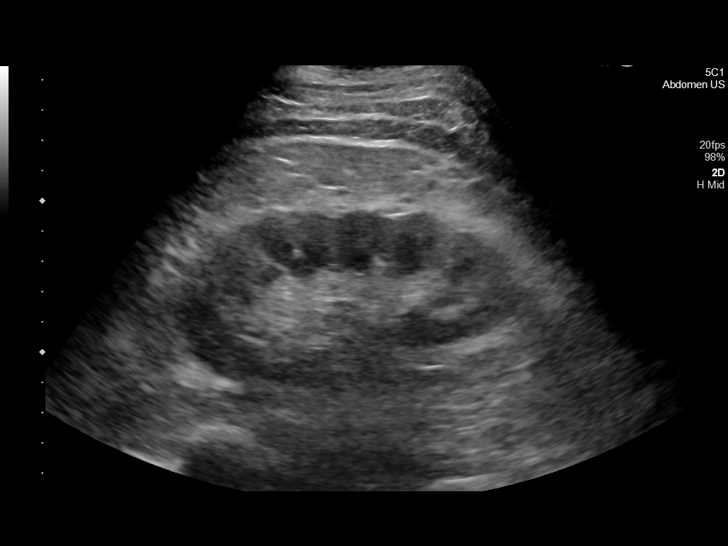
[im 62/75]
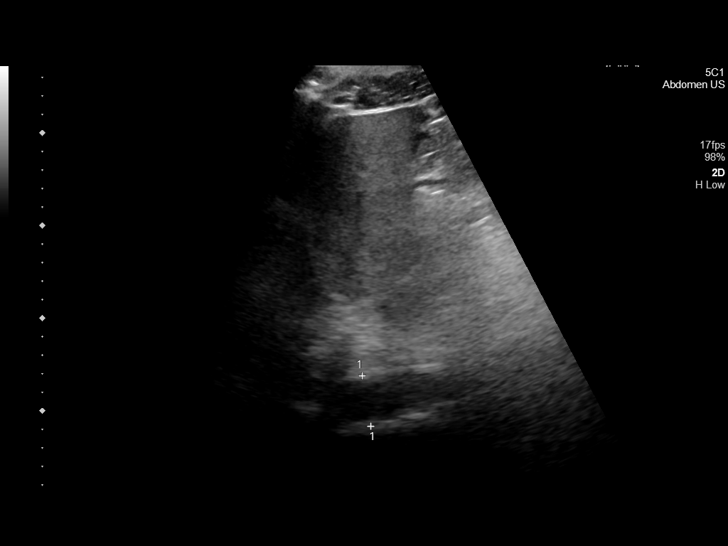
[im 68/75]
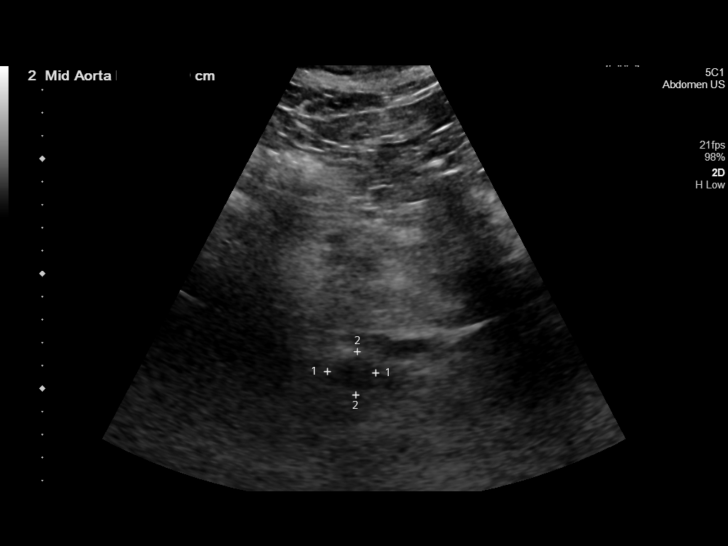
[im 75/75]
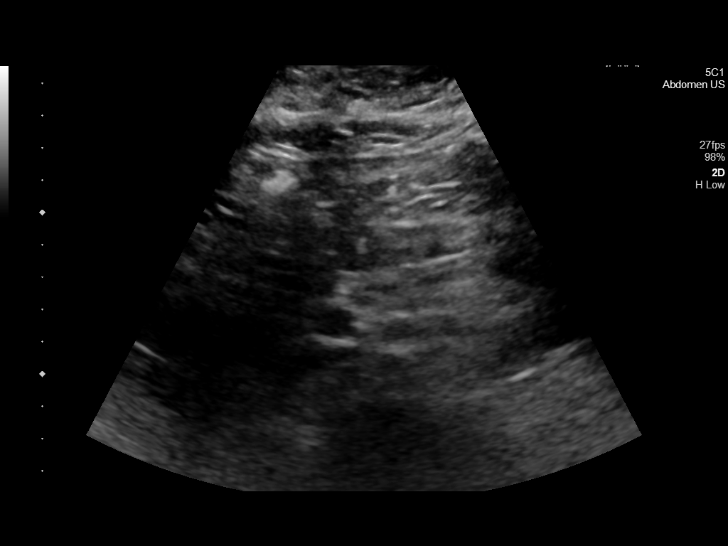

[14 of 25 positions shown; findings below may reference images not displayed]

FINDINGS: Gallbladder: Remote cholecystectomy

Common bile duct: Diameter: 4.4 mm

Liver: Increased hepatic echogenicity compatible with hepatic
steatosis. No large focal hepatic abnormality or intrahepatic
biliary dilatation. Portal vein is patent on color Doppler imaging
with normal direction of blood flow towards the liver.

IVC: No abnormality visualized.

Pancreas: Not visualized because of obscuring bowel gas

Spleen: Size and appearance within normal limits.

Right Kidney: Length: 12.1 cm. Echogenicity within normal limits. No
mass or hydronephrosis visualized.

Left Kidney: Length: 11.4 cm. Echogenicity within normal limits. No
mass or hydronephrosis visualized.

Abdominal aorta: Aorta atherosclerotic without aneurysm.

Other findings: No free fluid or ascites
IMPRESSION: Remote cholecystectomy

Hepatic steatosis

Obscured pancreas by bowel gas

Abdominal atherosclerosis without aneurysm

## 2022-06-18 ENCOUNTER — Other Ambulatory Visit: Payer: Self-pay | Admitting: Orthopaedic Surgery

## 2022-06-18 DIAGNOSIS — G8929 Other chronic pain: Secondary | ICD-10-CM

## 2022-06-19 ENCOUNTER — Ambulatory Visit
Admission: RE | Admit: 2022-06-19 | Discharge: 2022-06-19 | Disposition: A | Discharge status: Home / Self Care | Payer: Medicare Other | Source: Ambulatory Visit | Attending: Orthopaedic Surgery | Admitting: Orthopaedic Surgery

## 2022-06-19 DIAGNOSIS — G8929 Other chronic pain: Secondary | ICD-10-CM
